# Patient Record
Sex: Female | Born: 1945 | Race: Black or African American | Hispanic: No | Marital: Single | Smoking: Never smoker
Health system: Southern US, Community
[De-identification: ages and names within clinical notes are randomized; demographics above are authoritative.]

## PROBLEM LIST (undated history)

## (undated) DIAGNOSIS — K7460 Unspecified cirrhosis of liver: Secondary | ICD-10-CM

## (undated) DIAGNOSIS — I10 Essential (primary) hypertension: Secondary | ICD-10-CM

## (undated) DIAGNOSIS — K219 Gastro-esophageal reflux disease without esophagitis: Secondary | ICD-10-CM

## (undated) DIAGNOSIS — L03119 Cellulitis of unspecified part of limb: Secondary | ICD-10-CM

## (undated) HISTORY — DX: Cellulitis of unspecified part of limb: L03.119

---

## 2007-05-20 ENCOUNTER — Encounter: Payer: Self-pay | Admitting: Family Medicine

## 2007-06-04 ENCOUNTER — Encounter: Payer: Self-pay | Admitting: Family Medicine

## 2009-01-03 ENCOUNTER — Ambulatory Visit: Payer: Self-pay

## 2014-01-10 ENCOUNTER — Inpatient Hospital Stay: Payer: Self-pay | Admitting: Internal Medicine

## 2014-01-10 LAB — URINALYSIS, COMPLETE
BACTERIA: NONE SEEN
BILIRUBIN, UR: NEGATIVE
Blood: NEGATIVE
Glucose,UR: NEGATIVE mg/dL (ref 0–75)
Leukocyte Esterase: NEGATIVE
Nitrite: NEGATIVE
Ph: 6 (ref 4.5–8.0)
Specific Gravity: 1.021 (ref 1.003–1.030)
Squamous Epithelial: 3

## 2014-01-10 LAB — COMPREHENSIVE METABOLIC PANEL
ALBUMIN: 2.3 g/dL — AB (ref 3.4–5.0)
ALT: 37 U/L (ref 12–78)
ANION GAP: 11 (ref 7–16)
Alkaline Phosphatase: 149 U/L — ABNORMAL HIGH
BILIRUBIN TOTAL: 0.9 mg/dL (ref 0.2–1.0)
BUN: 5 mg/dL — AB (ref 7–18)
CO2: 31 mmol/L (ref 21–32)
CREATININE: 0.46 mg/dL — AB (ref 0.60–1.30)
Calcium, Total: 7.5 mg/dL — ABNORMAL LOW (ref 8.5–10.1)
Chloride: 98 mmol/L (ref 98–107)
EGFR (African American): >60
EGFR (Non-African Amer.): >60
GLUCOSE: 87 mg/dL (ref 65–99)
OSMOLALITY: 276 (ref 275–301)
POTASSIUM: 3.2 mmol/L — AB (ref 3.5–5.1)
SGOT(AST): 127 U/L — ABNORMAL HIGH (ref 15–37)
SODIUM: 140 mmol/L (ref 136–145)
Total Protein: 7 g/dL (ref 6.4–8.2)

## 2014-01-10 LAB — CBC WITH DIFFERENTIAL/PLATELET
BASOS ABS: 0 10*3/uL (ref 0.0–0.1)
BASOS PCT: 0.9 %
EOS PCT: 0.1 %
Eosinophil #: 0 10*3/uL (ref 0.0–0.7)
HCT: 34 % — ABNORMAL LOW (ref 35.0–47.0)
HGB: 11.2 g/dL — AB (ref 12.0–16.0)
LYMPHS ABS: 0.8 10*3/uL — AB (ref 1.0–3.6)
LYMPHS PCT: 27.5 %
MCH: 31.6 pg (ref 26.0–34.0)
MCHC: 32.9 g/dL (ref 32.0–36.0)
MCV: 96 fL (ref 80–100)
Monocyte #: 0.2 x10 3/mm (ref 0.2–0.9)
Monocyte %: 6.7 %
Neutrophil #: 1.8 10*3/uL (ref 1.4–6.5)
Neutrophil %: 64.8 %
Platelet: 87 10*3/uL — ABNORMAL LOW (ref 150–440)
RBC: 3.54 10*6/uL — ABNORMAL LOW (ref 3.80–5.20)
RDW: 20.6 % — ABNORMAL HIGH (ref 11.5–14.5)
WBC: 2.8 10*3/uL — ABNORMAL LOW (ref 3.6–11.0)

## 2014-01-10 LAB — TSH: THYROID STIMULATING HORM: 3.81 u[IU]/mL

## 2014-01-11 LAB — BASIC METABOLIC PANEL
Anion Gap: 12 (ref 7–16)
BUN: 4 mg/dL — AB (ref 7–18)
CALCIUM: 7.2 mg/dL — AB (ref 8.5–10.1)
CREATININE: 0.66 mg/dL (ref 0.60–1.30)
Chloride: 101 mmol/L (ref 98–107)
Co2: 28 mmol/L (ref 21–32)
EGFR (African American): >60
EGFR (Non-African Amer.): >60
GLUCOSE: 72 mg/dL (ref 65–99)
OSMOLALITY: 277 (ref 275–301)
Potassium: 2.9 mmol/L — ABNORMAL LOW (ref 3.5–5.1)
Sodium: 141 mmol/L (ref 136–145)

## 2014-01-11 LAB — CBC WITH DIFFERENTIAL/PLATELET
BASOS ABS: 0 10*3/uL (ref 0.0–0.1)
Basophil %: 0.5 %
EOS ABS: 0 10*3/uL (ref 0.0–0.7)
Eosinophil %: 0.1 %
HCT: 29.9 % — ABNORMAL LOW (ref 35.0–47.0)
HGB: 9.8 g/dL — ABNORMAL LOW (ref 12.0–16.0)
Lymphocyte #: 0.5 10*3/uL — ABNORMAL LOW (ref 1.0–3.6)
Lymphocyte %: 16.2 %
MCH: 31.2 pg (ref 26.0–34.0)
MCHC: 32.7 g/dL (ref 32.0–36.0)
MCV: 96 fL (ref 80–100)
Monocyte #: 0.2 x10 3/mm (ref 0.2–0.9)
Monocyte %: 6.3 %
Neutrophil #: 2.2 10*3/uL (ref 1.4–6.5)
Neutrophil %: 76.9 %
Platelet: 63 10*3/uL — ABNORMAL LOW (ref 150–440)
RBC: 3.13 10*6/uL — AB (ref 3.80–5.20)
RDW: 20.4 % — AB (ref 11.5–14.5)
WBC: 2.8 10*3/uL — AB (ref 3.6–11.0)

## 2014-01-11 LAB — MAGNESIUM: MAGNESIUM: 1.5 mg/dL — AB

## 2014-01-12 LAB — CBC WITH DIFFERENTIAL/PLATELET
Basophil #: 0 10*3/uL (ref 0.0–0.1)
Basophil %: 0.8 %
Eosinophil #: 0 10*3/uL (ref 0.0–0.7)
Eosinophil %: 1.5 %
HCT: 31.6 % — ABNORMAL LOW (ref 35.0–47.0)
HGB: 10.4 g/dL — ABNORMAL LOW (ref 12.0–16.0)
Lymphocyte #: 0.7 10*3/uL — ABNORMAL LOW (ref 1.0–3.6)
Lymphocyte %: 24.4 %
MCH: 32.1 pg (ref 26.0–34.0)
MCHC: 33.1 g/dL (ref 32.0–36.0)
MCV: 97 fL (ref 80–100)
MONOS PCT: 8.4 %
Monocyte #: 0.2 x10 3/mm (ref 0.2–0.9)
NEUTROS ABS: 1.8 10*3/uL (ref 1.4–6.5)
Neutrophil %: 64.9 %
Platelet: 64 10*3/uL — ABNORMAL LOW (ref 150–440)
RBC: 3.25 10*6/uL — ABNORMAL LOW (ref 3.80–5.20)
RDW: 20.4 % — ABNORMAL HIGH (ref 11.5–14.5)
WBC: 2.7 10*3/uL — ABNORMAL LOW (ref 3.6–11.0)

## 2014-01-12 LAB — BASIC METABOLIC PANEL
ANION GAP: 6 — AB (ref 7–16)
BUN: 5 mg/dL — ABNORMAL LOW (ref 7–18)
CHLORIDE: 101 mmol/L (ref 98–107)
CO2: 32 mmol/L (ref 21–32)
CREATININE: 0.61 mg/dL (ref 0.60–1.30)
Calcium, Total: 8.2 mg/dL — ABNORMAL LOW (ref 8.5–10.1)
EGFR (African American): >60
EGFR (Non-African Amer.): >60
Glucose: 102 mg/dL — ABNORMAL HIGH (ref 65–99)
Osmolality: 275 (ref 275–301)
Potassium: 3.2 mmol/L — ABNORMAL LOW (ref 3.5–5.1)
Sodium: 139 mmol/L (ref 136–145)

## 2014-01-13 LAB — MAGNESIUM: MAGNESIUM: 1.6 mg/dL — AB

## 2014-01-13 LAB — POTASSIUM: Potassium: 3.3 mmol/L — ABNORMAL LOW (ref 3.5–5.1)

## 2014-01-14 LAB — BASIC METABOLIC PANEL
ANION GAP: 4 — AB (ref 7–16)
BUN: 4 mg/dL — ABNORMAL LOW (ref 7–18)
CO2: 31 mmol/L (ref 21–32)
Calcium, Total: 7.6 mg/dL — ABNORMAL LOW (ref 8.5–10.1)
Chloride: 104 mmol/L (ref 98–107)
Creatinine: 0.58 mg/dL — ABNORMAL LOW (ref 0.60–1.30)
EGFR (African American): >60
EGFR (Non-African Amer.): >60
Glucose: 96 mg/dL (ref 65–99)
Osmolality: 274 (ref 275–301)
POTASSIUM: 3.3 mmol/L — AB (ref 3.5–5.1)
Sodium: 139 mmol/L (ref 136–145)

## 2014-01-14 LAB — MAGNESIUM: MAGNESIUM: 1.8 mg/dL

## 2014-01-15 LAB — PATHOLOGY REPORT

## 2014-01-15 LAB — CULTURE, BLOOD (SINGLE)

## 2014-01-21 LAB — WOUND CULTURE

## 2014-01-27 ENCOUNTER — Ambulatory Visit: Payer: Self-pay | Admitting: Internal Medicine

## 2014-01-27 LAB — CBC CANCER CENTER
Eosinophil: 2 %
HCT: 32.7 % — AB (ref 35.0–47.0)
HGB: 10.4 g/dL — ABNORMAL LOW (ref 12.0–16.0)
Lymphocytes: 20 %
MCH: 31.2 pg (ref 26.0–34.0)
MCHC: 31.8 g/dL — AB (ref 32.0–36.0)
MCV: 98 fL (ref 80–100)
Monocytes: 17 %
Platelet: 211 x10 3/mm (ref 150–440)
RBC: 3.33 10*6/uL — ABNORMAL LOW (ref 3.80–5.20)
RDW: 17.4 % — AB (ref 11.5–14.5)
Segmented Neutrophils: 61 %
WBC: 3.8 x10 3/mm (ref 3.6–11.0)

## 2014-01-27 LAB — FOLATE: FOLIC ACID: 15.1 ng/mL (ref 3.1–100.0)

## 2014-01-27 LAB — IRON AND TIBC
IRON: 43 ug/dL — AB (ref 50–170)
Iron Bind.Cap.(Total): 215 ug/dL — ABNORMAL LOW (ref 250–450)
Iron Saturation: 20 %
UNBOUND IRON-BIND. CAP.: 172 ug/dL

## 2014-01-27 LAB — LACTATE DEHYDROGENASE: LDH: 337 U/L — AB (ref 81–246)

## 2014-01-27 LAB — RETICULOCYTES
Absolute Retic Count: 0.0507 10*6/uL (ref 0.019–0.186)
RETICULOCYTE: 1.52 % (ref 0.4–3.1)

## 2014-01-27 LAB — FERRITIN: Ferritin (ARMC): 182 ng/mL (ref 8–388)

## 2014-01-29 LAB — APTT: ACTIVATED PTT: 36.8 s — AB (ref 23.6–35.9)

## 2014-01-29 LAB — PROTIME-INR
INR: 1
Prothrombin Time: 13.3 secs (ref 11.5–14.7)

## 2014-01-29 LAB — FIBRIN DEGRADATION PROD.(ARMC ONLY): Fibrin Degradation Prod.: <10 ug/ml (ref 2.1–7.7)

## 2014-01-29 LAB — FIBRINOGEN: Fibrinogen: 436 mg/dL (ref 210–470)

## 2014-02-01 ENCOUNTER — Ambulatory Visit: Payer: Self-pay | Admitting: Internal Medicine

## 2014-02-01 LAB — URINE IEP, RANDOM

## 2014-02-02 LAB — PROT IMMUNOELECTROPHORES(ARMC)

## 2014-02-02 LAB — CULTURE, FUNGUS WITHOUT SMEAR

## 2014-06-18 ENCOUNTER — Ambulatory Visit: Payer: Self-pay | Admitting: Internal Medicine

## 2014-06-18 LAB — CBC CANCER CENTER
BASOS ABS: 0 x10 3/mm (ref 0.0–0.1)
BASOS PCT: 0.9 %
EOS PCT: 0.6 %
Eosinophil #: 0 x10 3/mm (ref 0.0–0.7)
HCT: 34.9 % — AB (ref 35.0–47.0)
HGB: 11.3 g/dL — ABNORMAL LOW (ref 12.0–16.0)
Lymphocyte #: 0.8 x10 3/mm — ABNORMAL LOW (ref 1.0–3.6)
Lymphocyte %: 15.7 %
MCH: 29.9 pg (ref 26.0–34.0)
MCHC: 32.3 g/dL (ref 32.0–36.0)
MCV: 92 fL (ref 80–100)
MONOS PCT: 5.3 %
Monocyte #: 0.3 x10 3/mm (ref 0.2–0.9)
Neutrophil #: 4.1 x10 3/mm (ref 1.4–6.5)
Neutrophil %: 77.5 %
Platelet: 201 x10 3/mm (ref 150–440)
RBC: 3.78 10*6/uL — AB (ref 3.80–5.20)
RDW: 15.5 % — AB (ref 11.5–14.5)
WBC: 5.3 x10 3/mm (ref 3.6–11.0)

## 2014-07-04 ENCOUNTER — Ambulatory Visit: Payer: Self-pay | Admitting: Internal Medicine

## 2014-12-25 NOTE — Consult Note (Signed)
Brief Consult Note: Diagnosis: large venous ulcer.   Patient was seen by consultant.   Comments: would favor cleaning woud with biopsy in the or  no Maggots seen today continue ABx as underlying cellulitis is present would plan for Unna boots then compression with lymphedema pump as long term therapy.  Electronic Signatures: Hortencia Pilar (MD)  (Signed 11-May-15 09:16)  Authored: Brief Consult Note   Last Updated: 11-May-15 09:16 by Hortencia Pilar (MD)

## 2014-12-25 NOTE — Discharge Summary (Signed)
PATIENT NAME:  Morgan Mcpherson, Morgan Mcpherson MR#:  [id number] DATE OF BIRTH:  1946/08/03  DATE OF ADMISSION:  01/10/2014 DATE OF DISCHARGE:  01/15/2014  PRIMARY CARE PHYSICIAN: Nonlocal.  DISCHARGE DIAGNOSES:  1.  Left lower extremity ulcer and cellulitis.  2.  Hypokalemia.  3.  Hypomagnesemia.  4.  Pancytopenia. 5.  Alcohol abuse.   CONDITION: Stable.   CODE STATUS: Full code.   HOME MEDICATIONS: Please refer to the medication reconciliation list.   DIET: Regular diet.   ACTIVITY: As tolerated.   The patient needs home health and physical therapy and also Education officer, museum. The patient needs a rolling walker.   FOLLOWUP CARE: Follow up with PCP within 1 to 2 weeks. Follow up with Dr. Delana Meyer within 1 to 2 weeks. The patient also needs to follow up with Dr. Ma Hillock for pancytopenia within 1 to 2 weeks. The patient also needs alcohol cessation.   REASON FOR ADMISSION: Left leg infection.   HOSPITAL COURSE: The patient is a 69 year old African American female with a history of alcohol abuse. Came to ED due to left leg infection worsening for one week prior to this admission. The patient was getting some antibiotics recently at [facility], but because of the pain and the swelling, the patient came to ED. For detailed history and physical examination, please refer to the admission note dictated by Dr. Benjie Karvonen. Laboratory data on admission date showed WBC 2.8, hemoglobin 11.2, platelets 87,000. Sodium 140, potassium 3.2, chloride 98, BUN 5, creatinine 0.46. Chest x-ray did not show any acute cardiopulmonary disease.  1.  Left leg ulcer with infection: After admission, the patient has been treated with Zosyn. Surgical consult suggested no debridement at this time. They recommended vascular surgery consult and wound care. Dr. Delana Meyer evaluated the patient and recommended local wound care prior to any vascular intervention. He did a wound biopsy. The patient is on wound dressing with Unna boots. Blood culture  is negative so far.  2.  For alcohol abuse, the patient was on CIWA protocol. No withdrawal symptoms.  3.  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 The patient needs to follow up with hematology as outpatient.  4.  Hypokalemia was treated with potassium supplement. 5.  Hypomagnesemia was treated with magnesium supplement, improved.   The patient is clinically stable. According to physical therapy evaluation, the patient needs skilled nursing facility placement; however, the patient refused to go to skilled nursing facility. She wants to go home with home health. The patient will be discharged to home with home health, PT, and social worker. I discussed the patient's discharge plan with the patient, nurse, case manager, Education officer, museum.   TIME SPENT: About 40 minutes.   ____________________________ Demetrios Loll, MD qc:jcm D: 01/15/2014 12:33:14 ET T: 01/15/2014 21:16:44 ET JOB#: [id number]  cc: Demetrios Loll, MD, <Dictator> Demetrios Loll MD ELECTRONICALLY SIGNED 01/23/2014 13:16

## 2014-12-25 NOTE — H&P (Signed)
PATIENT NAME:  Morgan Mcpherson, Morgan Mcpherson MR#:  [id number] DATE OF BIRTH:  02/24/1946  DATE OF ADMISSION:  01/10/2014  PRIMARY CARE PHYSICIAN: [facility].   CHIEF COMPLAINT: Left leg infection.   HISTORY OF PRESENT ILLNESS:  This 69 year old female who says for the past few weeks she has been seeing Morgan Mcpherson for this left leg infection which started out about a year ago with 2 blisters subsequently.  She says over the past week these really have worsened.  They suspected an insect bite about a year ago. She said she was getting some antibiotics recently at [facility] clinic.  She came in today because of the pain and the swelling. In the ER, it was noted her left leg had maggots which I did actually visually see.   REVIEW OF SYSTEMS: CONSTITUTIONAL: No fever. Positive fatigue, weakness.   EYES:  No blurred or double vision.  No glaucoma or cataracts.   EARS, NOSE, AND THROAT:  No ear pain, hearing loss, redness of oropharynx.  RESPIRATORY: No cough, wheezing, hemoptysis, COPD, dyspnea.  CARDIOVASCULAR:  No chest pain, orthopnea.  Positive edema. No arrhythmia, dyspnea on exertion. GASTROINTESTINAL: No nausea, vomiting, diarrhea, abdominal pain, melena, or ulcers. GENITOURINARY:   No dysuria or hematuria.  ENDOCRINE: No polyuria or polydipsia.  HEMATOLOGY AND LYMPHATIC:  Positive left leg ulcer and both of her legs are scaly.  MUSCULOSKELETAL: Limited activity due to her left leg pain.  NEUROLOGICAL: no focal defecits  PSYCHIATRIC:  Anxiety or depression.   PAST MEDICAL HISTORY:  Alcohol abuse.   MEDICATIONS: None.   SOCIAL HISTORY: The patient says she drinks about 6 beers a day. No IV drug use or alcohol.   FAMILY HISTORY: No history of hypertension, diabetes.   PAST SURGICAL HISTORY: None.   PHYSICAL EXAMINATION:  VITAL SIGNS: Temperature 98.4, pulse 108, respirations 18, blood pressure 130/65, 96% on room air. GENERAL: The patient is alert, oriented.  No acute distress.   HEENT:  Head is atraumatic. Pupils are round and reactive, anicteric sclerae.  Mucous membranes are dry. Oropharynx is clear.  NECK: Supple without JVD, carotid bruit, or enlarged thyroid.  CARDIOVASCULAR: Regular rate and rhythm. No murmurs, gallops, or rubs. PMI is not displaced.  LUNGS: Clear to auscultation without crackles, rales, rhonchi, or wheezing. Normal to percussion.  ABDOMEN: Obese, bowel sounds are positive. Nontender. Hard to appreciate organomegaly due to body habitus.  EXTREMITIES: She has tenderness and swelling of her left leg. Both legs are actually swollen with chronic venous stasis changes. Her left leg has confluent area of skin breakdown.  She has maggots that I visually saw.  She has a large ulcer there on that left leg.  NEUROLOGIC: Cranial nerves II through XII are grossly intact.   LABORATORY DATA:  White blood cells 2.8, hemoglobin 11.2, hematocrit 34, platelets are 87,000. Sodium 140, potassium 3.2, chloride 98, bicarbonate 31, BUN 5, creatinine 0.46, glucose 87, AST 37, AST is 127, ALT 37, alkaline phosphatase 149, bilirubin 0.9, calcium 7.5, total protein 7.0, albumin 2.3. TSH 3.81. Chest x-ray shows no acute cardiopulmonary disease.  Tibia fibula left x-ray is negative.   DIAGNOSTIC DATA:  EKG: Normal sinus rhythm. No ST elevations or depressions in the left anterior fascicular block.   ASSESSMENT AND PLAN: A 69 year old female with a history of alcohol abuse who has had an ulcer on her leg for the past year progressively worse, foul-smelling, has maggots.  1. Left leg ulcer as per surgical consultation. They do not recommend  debridement. They do not think that this needs debridement at this time. They recommend vascular surgery and wound care.  I have consulted both vascular surgery and wound care empirically.  I will place the patient on Zosyn and monitor, but ultimately, if they are not going to debride her leg and vascular surgery did not feel they can offer  anything she will just need wound care.  2. Systemic inflammatory response syndrome with leukopenia and tachycardia, likely secondary to number one and/or alcohol abuse. We will monitor.  3. Alcohol abuse.  Patient is on CIWA protocol.  4. Hypokalemia. We will go ahead and replete.  5. Thrombocytopenia suspected from alcohol abuse. Will not use heparin for DVT prophylaxis and cannot really use SCDs on that left leg.   CODE STATUS:   Patient is full code status.    TIME SPENT:  Approximately 55 minutes.   ____________________________  Donell Beers. Benjie Karvonen, MD spm:dd D: 01/10/2014 16:05:15 ET T: 01/10/2014 19:15:47 ET JOB#: [id number]  cc: Shivali Quackenbush P. Benjie Karvonen, MD, <Dictator> Donell Beers Louanne Calvillo MD ELECTRONICALLY SIGNED 01/10/2014 20:41

## 2015-02-02 ENCOUNTER — Ambulatory Visit: Payer: Self-pay | Admitting: Internal Medicine

## 2015-02-02 ENCOUNTER — Other Ambulatory Visit: Payer: Self-pay

## 2015-02-08 ENCOUNTER — Ambulatory Visit: Payer: Self-pay | Admitting: Internal Medicine

## 2015-02-08 ENCOUNTER — Other Ambulatory Visit: Payer: Self-pay

## 2015-02-09 ENCOUNTER — Other Ambulatory Visit: Payer: Self-pay

## 2015-02-09 ENCOUNTER — Ambulatory Visit: Payer: Medicare Other | Admitting: Internal Medicine

## 2015-02-09 ENCOUNTER — Inpatient Hospital Stay: Payer: Medicare Other | Attending: Internal Medicine

## 2015-02-09 DIAGNOSIS — D61818 Other pancytopenia: Secondary | ICD-10-CM | POA: Insufficient documentation

## 2015-02-09 DIAGNOSIS — R634 Abnormal weight loss: Secondary | ICD-10-CM | POA: Insufficient documentation

## 2015-02-09 DIAGNOSIS — R05 Cough: Secondary | ICD-10-CM | POA: Insufficient documentation

## 2015-02-09 DIAGNOSIS — R109 Unspecified abdominal pain: Secondary | ICD-10-CM | POA: Insufficient documentation

## 2015-02-09 DIAGNOSIS — D472 Monoclonal gammopathy: Secondary | ICD-10-CM | POA: Insufficient documentation

## 2015-03-01 ENCOUNTER — Inpatient Hospital Stay: Payer: Medicare Other

## 2015-03-01 ENCOUNTER — Encounter: Payer: Self-pay | Admitting: Internal Medicine

## 2015-03-01 ENCOUNTER — Inpatient Hospital Stay (HOSPITAL_BASED_OUTPATIENT_CLINIC_OR_DEPARTMENT_OTHER): Payer: Medicare Other | Admitting: Internal Medicine

## 2015-03-01 VITALS — BP 160/75 | HR 53 | Temp 98.3°F | Resp 18 | Ht 61.97 in | Wt 160.0 lb

## 2015-03-01 DIAGNOSIS — D61818 Other pancytopenia: Secondary | ICD-10-CM

## 2015-03-01 DIAGNOSIS — D472 Monoclonal gammopathy: Secondary | ICD-10-CM | POA: Diagnosis present

## 2015-03-01 DIAGNOSIS — R05 Cough: Secondary | ICD-10-CM | POA: Diagnosis not present

## 2015-03-01 DIAGNOSIS — R109 Unspecified abdominal pain: Secondary | ICD-10-CM

## 2015-03-01 DIAGNOSIS — R634 Abnormal weight loss: Secondary | ICD-10-CM | POA: Diagnosis not present

## 2015-03-01 LAB — CBC WITH DIFFERENTIAL/PLATELET
BASOS ABS: 0 10*3/uL (ref 0–0.1)
Basophils Relative: 1 %
EOS PCT: 1 %
Eosinophils Absolute: 0 10*3/uL (ref 0–0.7)
HCT: 36.5 % (ref 35.0–47.0)
Hemoglobin: 11.8 g/dL — ABNORMAL LOW (ref 12.0–16.0)
LYMPHS PCT: 39 %
Lymphs Abs: 1.1 10*3/uL (ref 1.0–3.6)
MCH: 31.4 pg (ref 26.0–34.0)
MCHC: 32.3 g/dL (ref 32.0–36.0)
MCV: 97.4 fL (ref 80.0–100.0)
Monocytes Absolute: 0.3 10*3/uL (ref 0.2–0.9)
Monocytes Relative: 13 %
NEUTROS ABS: 1.3 10*3/uL — AB (ref 1.4–6.5)
NEUTROS PCT: 46 %
PLATELETS: 154 10*3/uL (ref 150–440)
RBC: 3.75 MIL/uL — ABNORMAL LOW (ref 3.80–5.20)
RDW: 14.3 % (ref 11.5–14.5)
WBC: 2.7 10*3/uL — AB (ref 3.6–11.0)

## 2015-03-01 LAB — IRON AND TIBC
Iron: 76 ug/dL (ref 28–170)
SATURATION RATIOS: 25 % (ref 10.4–31.8)
TIBC: 310 ug/dL (ref 250–450)
UIBC: 234 ug/dL

## 2015-03-01 LAB — CREATININE, SERUM
CREATININE: 0.53 mg/dL (ref 0.44–1.00)
GFR calc Af Amer: >60 mL/min (ref >60)

## 2015-03-01 LAB — CALCIUM: Calcium: 8 mg/dL — ABNORMAL LOW (ref 8.9–10.3)

## 2015-03-01 LAB — LACTATE DEHYDROGENASE: LDH: 139 U/L (ref 98–192)

## 2015-03-02 LAB — PROTEIN ELECTROPHORESIS, SERUM
A/G Ratio: 1 (ref 0.7–1.7)
ALBUMIN ELP: 3.1 g/dL (ref 2.9–4.4)
ALPHA-1-GLOBULIN: 0.2 g/dL (ref 0.0–0.4)
Alpha-2-Globulin: 0.6 g/dL (ref 0.4–1.0)
BETA GLOBULIN: 1 g/dL (ref 0.7–1.3)
GAMMA GLOBULIN: 1.1 g/dL (ref 0.4–1.8)
GLOBULIN, TOTAL: 3 g/dL (ref 2.2–3.9)
M-SPIKE, %: 0.3 g/dL — AB
Total Protein ELP: 6.1 g/dL (ref 6.0–8.5)

## 2015-03-02 LAB — KAPPA/LAMBDA LIGHT CHAINS
KAPPA, LAMDA LIGHT CHAIN RATIO: 1.39 (ref 0.26–1.65)
Kappa free light chain: 33.86 mg/L — ABNORMAL HIGH (ref 3.30–19.40)
Lambda free light chains: 24.34 mg/L (ref 5.71–26.30)

## 2015-03-04 ENCOUNTER — Ambulatory Visit: Payer: Medicare Other | Admitting: Internal Medicine

## 2015-03-04 ENCOUNTER — Ambulatory Visit: Payer: Medicare Other

## 2015-03-08 ENCOUNTER — Inpatient Hospital Stay: Payer: Medicare Other | Attending: Internal Medicine | Admitting: Internal Medicine

## 2015-03-12 NOTE — Progress Notes (Signed)
Morgan Mcpherson  [phone] [phone]     ID: Morgan Mcpherson OB: 02-22-1946  MR#: [id number]  CSN#:[id number]  No care team member to display  CHIEF COMPLAINT/DIAGNOSIS:  Pancytopenia - likely secondary to alcohol intake. 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 Serum iron 43, TIBC low at 215, iron saturation 20%. PT/INR 1.0, PTT 36.8 seconds. Otherwise serum B12, folate, direct platelet antibody test, ANA, FDP, fibrinogen, HCV Ab, HIV Ab all unremarkable.   (Prior CBC on 01/10/14 showed WBC 2800 with 65% neutrophils, 27% lymphocytes, 6.7% monos, 0.1% eos, Hb 11.2, MCV 96, platelets 87, Cr 0.46, LFT unremarkable except low albumin of 2.3. Repeat CBC on 01/12/14 again showed WBC is low at 2700 with 65% neutrophils, Hb 10.4, platelets 64).  HISTORY OF PRESENT ILLNESS:  Patient returns for continued hematology followup, she was last seen in Oct 2015. She has lost about 40 lbs weight unintentionally. States she is eating the same. She has intermittent cough, intermittent abdominal discomfort. Denies new constipation, diarrhea, blood in stools or urine. She had low blood counts and had undergone workup which showed monoclonal gammopathy of unknown significance and mildly elevated serum LDH of unclear significance. States she is avoiding alcohol intake. Denies any fevers, chills, night sweats, or symptoms to suggest infection. No bleeding issues. No new bone pains.    REVIEW OF SYSTEMS:   ROS As in HPI above. In addition, no fever, chills or sweats. No new headaches or focal weakness.  No sore throat, cough, shortness of breath, sputum, hemoptysis or chest pain. No dizziness or palpitation. No abdominal pain, constipation, diarrhea, dysuria or hematuria. No new skin rash or bleeding  symptoms. No new paresthesias in extremities.   PAST MEDICAL HISTORY: Reviewed. Past Medical History  Diagnosis Date  . Cellulitis, leg           H/o left leg ulcer, infected May 2015 requiring hospitalization and antibiotics. Biopsy on 01/13/14: verrucous epidermal hyperplasia, dermal fibrosis and superficial chronic inflammation. Negative for malignancy.  Pancytopenia  History of regular alcohol intake, quit on 01/10/14.  PAST SURGICAL HISTORY: Reviewed. As above.  FAMILY HISTORY: Reviewed. Family History  Problem Relation Age of Onset  . Cancer Mother     unknown primary  Denies hematological disorders.  SOCIAL HISTORY: Reviewed. History  Substance Use Topics  . Smoking status: Never Smoker   . Smokeless tobacco: Not on file  . Alcohol Use: 0.0 oz/week    0 Standard drinks or equivalent per week     Comment: casual social drinker  Denies smoking. History of regular alcohol intake, about 6 beers daily for many years but has quit since 01/10/14. Denies recreational drug usage.  No Known Allergies  Current Outpatient Prescriptions  Medication Sig Dispense Refill  . NEOMYCIN-POLYMYXIN-HYDROCORTISONE (CORTISPORIN) 1 % SOLN otic solution Place 1 drop into the left ear 4 (four) times daily.     No current facility-administered medications for this visit.    PHYSICAL EXAM: Filed Vitals:   03/01/15 1129  BP: 160/75  Pulse: 53  Temp: 98.3 F (36.8 C)  Resp: 18     Body mass index is 29.29 kg/(m^2).       GENERAL: Patient is alert and oriented and in no acute distress. There is no icterus. HEENT: EOMs intact. Oral exam negative for thrush or lesions. No cervical lymphadenopathy.  LUNGS: Bilaterally clear to auscultation, no rhonchi. ABDOMEN: Soft, nontender. No hepatosplenomegaly clinically.  EXTREMITIES: No pedal edema. LYMPHATICS: No palpable adenopathy in axillary or inguinal areas.   LAB RESULTS:    Component Value Date/Time   NA 139 01/14/2014 0454   K 3.3*  01/14/2014 0454   CL 104 01/14/2014 0454   CO2 31 01/14/2014 0454   GLUCOSE 96 01/14/2014 0454   BUN 4* 01/14/2014 0454   CREATININE 0.53 03/01/2015 1030   CREATININE 0.58* 01/14/2014 0454   CALCIUM 8.0* 03/01/2015 1029   CALCIUM 7.6* 01/14/2014 0454   PROT 7.0 01/10/2014 1430   ALBUMIN 2.3* 01/10/2014 1430   AST 127* 01/10/2014 1430   ALT 37 01/10/2014 1430   ALKPHOS 149* 01/10/2014 1430   GFRNONAA >60 03/01/2015 1030   GFRNONAA >60 01/14/2014 0454   GFRAA >60 03/01/2015 1030   GFRAA >60 01/14/2014 0454   Lab Results  Component Value Date   WBC 2.7* 03/01/2015   NEUTROABS 1.3* 03/01/2015   HGB 11.8* 03/01/2015   HCT 36.5 03/01/2015   MCV 97.4 03/01/2015   PLT 154 03/01/2015    STUDIES: 01/10/14 - WBC 2800 with 65% neutrophils, 27% lymphocytes, 6.7% monocytes, 0.1% eosinophils, Hb 11.2, MCV 96, platelets 87, creatinine 0.46, LFT unremarkable except low albumin of 2.3.  01/12/14 - WBC low at 2700 with 65% neutrophils, Hb 10.4, platelets 64.  01/12/14 - Ultrasound Abdomen: IMPRESSION:  1. No acute findings.  2. Mild hepatomegaly with diffuse hepatic steatosis. A component of cirrhosis is possible. No liver mass or focal lesion.  3. Normal size spleen.   4. Gallbladder sludge.  No stones.   ASSESSMENT / PLAN:   1. Pancytopenia - likely secondary to alcohol intake. 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 Serum iron 43, TIBC low at 215, iron saturation 20%. PT/INR 1.0, PTT 36.8 seconds. Otherwise serum B12, folate, direct platelet antibody test, ANA, FDP, fibrinogen, HCV Ab, HIV Ab all unremarkable  - reviewed labs and d/w patient. Prior w/u was unremarkable for any obvious underlying hematological disorder. CBC repeated today shows WBC again below normal at 2700, hemoglobin better at 11.8,  platelet count low normal at 154K. Patient advised strongly to stay off of taking alcohol, and she is agreeable. She does not need any further intervention or workup for mild anemia at this time but will keep coming here for monitoring of monoclonal gammopathy of unknown significance (MGUS).  2. 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 No treatment needed at this time, plan is continued monitoring 3. 3. Unintentional weight loss- - will get CT chst/abd/pelvis and see her back after it is done. If CT scan is negative for lymphoma or malignancy, then plan is continued monitoring in few months with CBC, iron and TIBC, SIEP, creatinine, calcium, LDH, and kappa/lambda ratio quantitative 4. In between visits, she was advised to call or come to ER in case of any new symptoms or acute sickness. Patient is agreeable to this plan.    Morgan Alf, MD   03/12/2015 5:59 AM

## 2015-03-17 ENCOUNTER — Ambulatory Visit: Admission: RE | Admit: 2015-03-17 | Payer: Medicare Other | Source: Ambulatory Visit

## 2016-06-27 ENCOUNTER — Inpatient Hospital Stay: Payer: Medicare Other | Attending: Hematology and Oncology | Admitting: Hematology and Oncology

## 2016-06-27 ENCOUNTER — Encounter: Payer: Self-pay | Admitting: Hematology and Oncology

## 2016-06-27 ENCOUNTER — Inpatient Hospital Stay: Payer: Medicare Other

## 2016-06-27 VITALS — BP 109/66 | HR 56 | Temp 98.2°F | Resp 18 | Ht 62.21 in | Wt 157.6 lb

## 2016-06-27 DIAGNOSIS — K6289 Other specified diseases of anus and rectum: Secondary | ICD-10-CM | POA: Diagnosis not present

## 2016-06-27 DIAGNOSIS — D61818 Other pancytopenia: Secondary | ICD-10-CM

## 2016-06-27 DIAGNOSIS — L03119 Cellulitis of unspecified part of limb: Secondary | ICD-10-CM | POA: Insufficient documentation

## 2016-06-27 DIAGNOSIS — Z23 Encounter for immunization: Secondary | ICD-10-CM | POA: Diagnosis not present

## 2016-06-27 DIAGNOSIS — Z299 Encounter for prophylactic measures, unspecified: Secondary | ICD-10-CM

## 2016-06-27 DIAGNOSIS — R634 Abnormal weight loss: Secondary | ICD-10-CM | POA: Diagnosis not present

## 2016-06-27 DIAGNOSIS — Z809 Family history of malignant neoplasm, unspecified: Secondary | ICD-10-CM | POA: Insufficient documentation

## 2016-06-27 DIAGNOSIS — D472 Monoclonal gammopathy: Secondary | ICD-10-CM

## 2016-06-27 LAB — CBC WITH DIFFERENTIAL/PLATELET
BASOS PCT: 1 %
Basophils Absolute: 0 10*3/uL (ref 0–0.1)
EOS ABS: 0 10*3/uL (ref 0–0.7)
EOS PCT: 1 %
HCT: 31.4 % — ABNORMAL LOW (ref 35.0–47.0)
Hemoglobin: 10.7 g/dL — ABNORMAL LOW (ref 12.0–16.0)
LYMPHS ABS: 1.3 10*3/uL (ref 1.0–3.6)
Lymphocytes Relative: 45 %
MCH: 31.5 pg (ref 26.0–34.0)
MCHC: 34.2 g/dL (ref 32.0–36.0)
MCV: 92.3 fL (ref 80.0–100.0)
MONOS PCT: 10 %
Monocytes Absolute: 0.3 10*3/uL (ref 0.2–0.9)
Neutro Abs: 1.3 10*3/uL — ABNORMAL LOW (ref 1.4–6.5)
Neutrophils Relative %: 43 %
PLATELETS: 133 10*3/uL — AB (ref 150–440)
RBC: 3.4 MIL/uL — ABNORMAL LOW (ref 3.80–5.20)
RDW: 14.1 % (ref 11.5–14.5)
WBC: 3 10*3/uL — AB (ref 3.6–11.0)

## 2016-06-27 LAB — IRON AND TIBC
Iron: 26 ug/dL — ABNORMAL LOW (ref 28–170)
Saturation Ratios: 15 % (ref 10.4–31.8)
TIBC: 179 ug/dL — ABNORMAL LOW (ref 250–450)
UIBC: 153 ug/dL

## 2016-06-27 LAB — RETICULOCYTES
RBC.: 3.4 MIL/uL — ABNORMAL LOW (ref 3.80–5.20)
Retic Count, Absolute: 30.6 10*3/uL (ref 19.0–183.0)
Retic Ct Pct: 0.9 % (ref 0.4–3.1)

## 2016-06-27 LAB — FERRITIN: FERRITIN: 297 ng/mL (ref 11–307)

## 2016-06-27 LAB — SEDIMENTATION RATE: Sed Rate: 2 mm/hr (ref 0–30)

## 2016-06-27 LAB — VITAMIN B12: Vitamin B-12: 398 pg/mL (ref 180–914)

## 2016-06-27 MED ORDER — INFLUENZA VAC SPLIT QUAD 0.5 ML IM SUSY
0.5000 mL | PREFILLED_SYRINGE | Freq: Once | INTRAMUSCULAR | Status: AC
  Administered 2016-06-27: 0.5 mL via INTRAMUSCULAR
  Filled 2016-06-27: qty 0.5

## 2016-06-27 NOTE — Assessment & Plan Note (Signed)
She has recent rectal discomfort. She never had screening colonoscopy. She declined rectal exam today. I recommend referral to GI but she declined and would like to wait to see how she feels in 2 weeks. I will reassess in 2 weeks

## 2016-06-27 NOTE — Assessment & Plan Note (Signed)
I discussed briefly the natural history of MGUS with the patient. She appeared to have poor understanding of the disease process. I will order for workup and see her back in 2 weeks to review test results with her.

## 2016-06-27 NOTE — Progress Notes (Signed)
[place] progress notes  Patient Care Team: Donnie Coffin, MD as PCP - General (Family Medicine)  CHIEF COMPLAINTS/PURPOSE OF VISIT:  MGUS and pancytopenia  HISTORY OF PRESENTING ILLNESS:  Morgan Mcpherson 70 y.o. female was transferred to my care after her prior physician has left.  I reviewed the patient's records extensive and collaborated the history with the patient. 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 The patient did not show up for imaging study as ordered.  She denies recent infection. No new bone pain. The patient denies any recent signs or symptoms of bleeding such as spontaneous epistaxis, hematuria or hematochezia. She complained of recent rectal pain after bowel movement for the last 2 days. It was not associated with any straining but she had stinging sensation of the bowel movement. She denies recent constipation. Her stool looked normal. The patient never had screening colonoscopy. She claims she has lost 40 pounds of weight but her weight appears to be stable compared to last year's weight. She told the previous oncologist the same concern. She denies recent anorexia, dysphagia or nausea. She does not appear to understand her reason for her to be seen here today.  MEDICAL HISTORY:  Past Medical History:  Diagnosis Date  . Cellulitis, leg     SURGICAL HISTORY: History reviewed. No pertinent surgical history.  SOCIAL HISTORY: Social History   Social History  . Marital status: Single    Spouse name: N/A  . Number of children: 0  . Years of education: N/A   Occupational History  . Psychologist, clinical, retired    Social History Main Topics  . Smoking status: Never Smoker  . Smokeless tobacco: Never Used  . Alcohol use 0.0 oz/week     Comment: casual social drinker  . Drug use: No  . Sexual activity: Not Currently   Other Topics Concern  . Not on file   Social  History Narrative  . No narrative on file    FAMILY HISTORY: Family History  Problem Relation Age of Onset  . Cancer Mother     unknown primary    ALLERGIES:  has No Known Allergies.  MEDICATIONS:  Current Outpatient Prescriptions  Medication Sig Dispense Refill  . NEOMYCIN-POLYMYXIN-HYDROCORTISONE (CORTISPORIN) 1 % SOLN otic solution Place 1 drop into the left ear 4 (four) times daily.     No current facility-administered medications for this visit.     REVIEW OF SYSTEMS:   Constitutional: Denies fevers, chills or abnormal night sweats Eyes: Denies blurriness of vision, double vision or watery eyes Ears, nose, mouth, throat, and face: Denies mucositis or sore throat Respiratory: Denies cough, dyspnea or wheezes Cardiovascular: Denies palpitation, chest discomfort or lower extremity swelling Skin: Denies abnormal skin rashes Lymphatics: Denies new lymphadenopathy or easy bruising Neurological:Denies numbness, tingling or new weaknesses Behavioral/Psych: Mood is stable, no new changes  All other systems were reviewed with the patient and are negative.  PHYSICAL EXAMINATION: ECOG PERFORMANCE STATUS: 0 - Asymptomatic  Vitals:   06/27/16 1343  BP: 109/66  Pulse: (!) 56  Resp: 18  Temp: 98.2 F (36.8 C)   Filed Weights   06/27/16 1343  Weight: 157 lb 10.1 oz (71.5 kg)    GENERAL:alert, no distress and comfortable SKIN: skin color, texture, turgor are normal, no rashes or significant lesions EYES: normal, conjunctiva are pink and non-injected, sclera clear OROPHARYNX:no exudate, normal lips, buccal mucosa, and tongue  NECK: supple, thyroid normal size, non-tender, without nodularity LYMPH:  no palpable lymphadenopathy in the cervical, axillary or inguinal LUNGS: clear to auscultation and percussion with normal breathing effort HEART: regular rate & rhythm and no murmurs without lower extremity edema ABDOMEN:abdomen soft, non-tender and normal bowel  sounds Musculoskeletal:no cyanosis of digits and no clubbing  PSYCH: alert & oriented x 3 with fluent speech NEURO: no focal motor/sensory deficits  LABORATORY DATA:  I have reviewed the data as listed Lab Results  Component Value Date   WBC 2.7 (L) 03/01/2015   HGB 11.8 (L) 03/01/2015   HCT 36.5 03/01/2015   MCV 97.4 03/01/2015   PLT 154 03/01/2015   No results for input(s): NA, K, CL, CO2, GLUCOSE, BUN, CREATININE, CALCIUM, GFRNONAA, GFRAA, PROT, ALBUMIN, AST, ALT, ALKPHOS, BILITOT, BILIDIR, IBILI in the last 8760 hours.  ASSESSMENT & PLAN:  MGUS (monoclonal gammopathy of unknown significance) I discussed briefly the natural history of MGUS with the patient. She appeared to have poor understanding of the disease process. I will order for workup and see her back in 2 weeks to review test results with her.  Other pancytopenia ([place]) She was noted to have pancytopenia in the past. I suspect the anemia could be related to renal disease and leukopenia could be related to her African-American heritage. In any case, I will order repeat workup and see her back in 2 weeks for further assessment  Rectal discomfort She has recent rectal discomfort. She never had screening colonoscopy. She declined rectal exam today. I recommend referral to GI but she declined and would like to wait to see how she feels in 2 weeks. I will reassess in 2 weeks  Preventive measure We discussed the importance of preventive care and reviewed the vaccination programs. She does not have any prior allergic reactions to influenza vaccination. She agrees to proceed with influenza vaccination today and we will administer it today at the clinic.    Orders Placed This Encounter  Procedures  . CBC with Differential/Platelet    Standing Status:   Future    Number of Occurrences:   1    Standing Expiration Date:   08/01/2017  . Ferritin    Standing Status:   Future    Number of Occurrences:   1    Standing  Expiration Date:   08/01/2017  . Iron and TIBC    Standing Status:   Future    Number of Occurrences:   1    Standing Expiration Date:   08/01/2017  . Vitamin B12    Standing Status:   Future    Number of Occurrences:   1    Standing Expiration Date:   08/01/2017  . Sedimentation rate    Standing Status:   Future    Number of Occurrences:   1    Standing Expiration Date:   08/01/2017  . Kappa/lambda light chains    Standing Status:   Future    Number of Occurrences:   1    Standing Expiration Date:   08/01/2017  . Multiple Myeloma Panel (SPEP&IFE w/QIG)    Standing Status:   Future    Number of Occurrences:   1    Standing Expiration Date:   08/01/2017  . Erythropoietin    Standing Status:   Future    Number of Occurrences:   1    Standing Expiration Date:   08/01/2017  . Reticulocytes ([place])    Standing Status:   Future  Standing Expiration Date:   08/01/2017    All questions were answered. The patient knows to call the clinic with any problems, questions or concerns. I spent 25 minutes counseling the patient face to face. The total time spent in the appointment was 40 minutes and more than 50% was on counseling.     Heath Lark, MD 06/27/2016 2:36 PM

## 2016-06-27 NOTE — Assessment & Plan Note (Signed)
We discussed the importance of preventive care and reviewed the vaccination programs. She does not have any prior allergic reactions to influenza vaccination. She agrees to proceed with influenza vaccination today and we will administer it today at the clinic.  

## 2016-06-27 NOTE — Progress Notes (Signed)
Patient does not offer any problems today.  

## 2016-06-27 NOTE — Assessment & Plan Note (Signed)
She was noted to have pancytopenia in the past. I suspect the anemia could be related to renal disease and leukopenia could be related to her African-American heritage. In any case, I will order repeat workup and see her back in 2 weeks for further assessment

## 2016-06-28 LAB — ERYTHROPOIETIN: Erythropoietin: 13.3 m[IU]/mL (ref 2.6–18.5)

## 2016-06-28 LAB — KAPPA/LAMBDA LIGHT CHAINS
KAPPA, LAMDA LIGHT CHAIN RATIO: 1.16 (ref 0.26–1.65)
Kappa free light chain: 33.9 mg/L — ABNORMAL HIGH (ref 3.3–19.4)
Lambda free light chains: 29.3 mg/L — ABNORMAL HIGH (ref 5.7–26.3)

## 2016-06-29 LAB — MULTIPLE MYELOMA PANEL, SERUM
ALBUMIN SERPL ELPH-MCNC: 2.8 g/dL — AB (ref 2.9–4.4)
ALPHA 1: 0.3 g/dL (ref 0.0–0.4)
ALPHA2 GLOB SERPL ELPH-MCNC: 0.6 g/dL (ref 0.4–1.0)
Albumin/Glob SerPl: 1 (ref 0.7–1.7)
B-GLOBULIN SERPL ELPH-MCNC: 0.9 g/dL (ref 0.7–1.3)
GAMMA GLOB SERPL ELPH-MCNC: 1.2 g/dL (ref 0.4–1.8)
GLOBULIN, TOTAL: 3 g/dL (ref 2.2–3.9)
IGG (IMMUNOGLOBIN G), SERUM: 927 mg/dL (ref 700–1600)
IgA: 612 mg/dL — ABNORMAL HIGH (ref 87–352)
IgM, Serum: 84 mg/dL (ref 26–217)
M PROTEIN SERPL ELPH-MCNC: 0.3 g/dL — AB
TOTAL PROTEIN ELP: 5.8 g/dL — AB (ref 6.0–8.5)

## 2016-07-11 ENCOUNTER — Inpatient Hospital Stay: Payer: Medicare Other | Attending: Hematology and Oncology | Admitting: Hematology and Oncology

## 2016-07-11 VITALS — BP 135/73 | HR 78 | Temp 98.0°F | Resp 18 | Wt 164.6 lb

## 2016-07-11 DIAGNOSIS — D472 Monoclonal gammopathy: Secondary | ICD-10-CM

## 2016-07-11 DIAGNOSIS — D61818 Other pancytopenia: Secondary | ICD-10-CM | POA: Diagnosis not present

## 2016-07-11 DIAGNOSIS — D696 Thrombocytopenia, unspecified: Secondary | ICD-10-CM | POA: Insufficient documentation

## 2016-07-11 DIAGNOSIS — D649 Anemia, unspecified: Secondary | ICD-10-CM | POA: Insufficient documentation

## 2016-07-11 DIAGNOSIS — D638 Anemia in other chronic diseases classified elsewhere: Secondary | ICD-10-CM

## 2016-07-11 DIAGNOSIS — D72819 Decreased white blood cell count, unspecified: Secondary | ICD-10-CM

## 2016-07-11 NOTE — Assessment & Plan Note (Signed)
I discussed briefly the natural history of MGUS with the patient. She appeared to have poor understanding of the disease process. Repeat blood work show IgG kappa with low M spike detectable She is not symptomatic Recommend yearly visit and close observation

## 2016-07-11 NOTE — Assessment & Plan Note (Signed)
She has minor pancytopenia Each will be addressed separately I will order ANA screening next year to exclude autoimmune disorder

## 2016-07-11 NOTE — Assessment & Plan Note (Signed)
She has very minor thrombocytopenia recently which I suspect was related to recent alcohol intake Her ultrasound liver 2015 showed possible fatty liver disease/early cirrhosis From the thrombocytopenia standpoint, I recommend refraining from alcohol intake and close monitoring

## 2016-07-11 NOTE — Assessment & Plan Note (Signed)
The patient have chronic leukopenia that fluctuated up and down I believed that is likely related to her African-American heritage She is not symptomatic. Recommend observation only

## 2016-07-11 NOTE — Assessment & Plan Note (Signed)
The anemia is likely anemia of chronic disease, with mild iron deficiency I recommend oral iron supplement and plan to recheck iron studies next year

## 2016-07-11 NOTE — Progress Notes (Signed)
[name] OFFICE PROGRESS NOTE  Patient Care Team: Donnie Coffin, MD as PCP - General (Family Medicine)  SUMMARY OF ONCOLOGIC HISTORY:  Morgan Mcpherson 70 y.o. female was transferred to my care after her prior physician has left.  I reviewed the patient's records extensive and collaborated the history with the patient. 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 The patient did not show up for imaging study as ordered. She subsequently returned in October 2017 for further follow-up  INTERVAL HISTORY: Please see below for problem oriented charting. She returns with her granddaughter today. She feels fine. Her recent rectal discomfort had resolved  REVIEW OF SYSTEMS:   Constitutional: Denies fevers, chills or abnormal weight loss Eyes: Denies blurriness of vision Ears, nose, mouth, throat, and face: Denies mucositis or sore throat Respiratory: Denies cough, dyspnea or wheezes Cardiovascular: Denies palpitation, chest discomfort or lower extremity swelling Gastrointestinal:  Denies nausea, heartburn or change in bowel habits Skin: Denies abnormal skin rashes Lymphatics: Denies new lymphadenopathy or easy bruising Neurological:Denies numbness, tingling or new weaknesses Behavioral/Psych: Mood is stable, no new changes  All other systems were reviewed with the patient and are negative.  I have reviewed the past medical history, past surgical history, social history and family history with the patient and they are unchanged from previous note.  ALLERGIES:  has No Known Allergies.  MEDICATIONS:  Current Outpatient Prescriptions  Medication Sig Dispense Refill  . NEOMYCIN-POLYMYXIN-HYDROCORTISONE (CORTISPORIN) 1 % SOLN otic solution Place 1 drop into the left ear 4 (four) times daily.     No current facility-administered medications for this visit.     PHYSICAL EXAMINATION: ECOG PERFORMANCE STATUS: 1 -  Symptomatic but completely ambulatory  Vitals:   07/11/16 1130  BP: 135/73  Pulse: 78  Resp: 18  Temp: 98 F (36.7 C)   Filed Weights   07/11/16 1130  Weight: 164 lb 9.2 oz (74.6 kg)    GENERAL:alert, no distress and comfortable SKIN: skin color, texture, turgor are normal, no rashes or significant lesions EYES: normal, Conjunctiva are pink and non-injected, sclera clear Musculoskeletal:no cyanosis of digits and no clubbing  NEURO: alert & oriented x 3 with fluent speech, no focal motor/sensory deficits  LABORATORY DATA:  I have reviewed the data as listed    Component Value Date/Time   NA 139 01/14/2014 0454   K 3.3 (L) 01/14/2014 0454   CL 104 01/14/2014 0454   CO2 31 01/14/2014 0454   GLUCOSE 96 01/14/2014 0454   BUN 4 (L) 01/14/2014 0454   CREATININE 0.53 03/01/2015 1030   CREATININE 0.58 (L) 01/14/2014 0454   CALCIUM 8.0 (L) 03/01/2015 1029   CALCIUM 7.6 (L) 01/14/2014 0454   PROT 7.0 01/10/2014 1430   ALBUMIN 2.3 (L) 01/10/2014 1430   AST 127 (H) 01/10/2014 1430   ALT 37 01/10/2014 1430   ALKPHOS 149 (H) 01/10/2014 1430   BILITOT 0.9 01/10/2014 1430   GFRNONAA >60 03/01/2015 1030   GFRNONAA >60 01/14/2014 0454   GFRAA >60 03/01/2015 1030   GFRAA >60 01/14/2014 0454    No results found for: SPEP, UPEP  Lab Results  Component Value Date   WBC 3.0 (L) 06/27/2016   NEUTROABS 1.3 (L) 06/27/2016   HGB 10.7 (L) 06/27/2016   HCT 31.4 (L) 06/27/2016   MCV 92.3 06/27/2016   PLT 133 (L) 06/27/2016      Chemistry      Component  Value Date/Time   NA 139 01/14/2014 0454   K 3.3 (L) 01/14/2014 0454   CL 104 01/14/2014 0454   CO2 31 01/14/2014 0454   BUN 4 (L) 01/14/2014 0454   CREATININE 0.53 03/01/2015 1030   CREATININE 0.58 (L) 01/14/2014 0454      Component Value Date/Time   CALCIUM 8.0 (L) 03/01/2015 1029   CALCIUM 7.6 (L) 01/14/2014 0454   ALKPHOS 149 (H) 01/10/2014 1430   AST 127 (H) 01/10/2014 1430   ALT 37 01/10/2014 1430   BILITOT 0.9  01/10/2014 1430      ASSESSMENT & PLAN:  MGUS (monoclonal gammopathy of unknown significance) I discussed briefly the natural history of MGUS with the patient. She appeared to have poor understanding of the disease process. Repeat blood work show IgG kappa with low M spike detectable She is not symptomatic Recommend yearly visit and close observation  Other pancytopenia ([place]) She has minor pancytopenia Each will be addressed separately I will order ANA screening next year to exclude autoimmune disorder  Chronic leukopenia The patient have chronic leukopenia that fluctuated up and down I believed that is likely related to her African-American heritage She is not symptomatic. 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 CBC with Differential/Platelet    Standing Status:   Future    Standing Expiration Date:   08/15/2017  . Comprehensive metabolic panel    Standing Status:   Future    Standing Expiration Date:   08/15/2017  . Kappa/lambda light chains    Standing Status:   Future    Standing Expiration Date:   08/15/2017  . Multiple Myeloma Panel (SPEP&IFE w/QIG)    Standing Status:   Future    Standing Expiration Date:   08/15/2017  . Iron and TIBC    Standing Status:   Future    Standing Expiration Date:   08/15/2017  . Ferritin    Standing Status:   Future    Standing Expiration Date:   08/15/2017  . ANA w/Reflex if Positive    Standing Status:   Future     Standing Expiration Date:   08/15/2017   All questions were answered. The patient knows to call the clinic with any problems, questions or concerns. No barriers to learning was detected. I spent 15 minutes counseling the patient face to face. The total time spent in the appointment was 20 minutes and more than 50% was on counseling and review of test results     Heath Lark, MD 07/11/2016 12:35 PM

## 2016-07-12 ENCOUNTER — Telehealth (INDEPENDENT_AMBULATORY_CARE_PROVIDER_SITE_OTHER): Payer: Self-pay | Admitting: Vascular Surgery

## 2016-07-12 NOTE — Telephone Encounter (Signed)
Pt lvm and stated she wanted to speak to someone concerning trouble with her legs, itching and swelling around ankles. Call back # [phone]

## 2016-07-19 NOTE — Telephone Encounter (Signed)
I agree with next available doesn't seem like an emergency

## 2016-08-21 ENCOUNTER — Other Ambulatory Visit: Payer: Self-pay | Admitting: Family Medicine

## 2016-08-21 DIAGNOSIS — Z1239 Encounter for other screening for malignant neoplasm of breast: Secondary | ICD-10-CM

## 2016-09-24 ENCOUNTER — Ambulatory Visit: Payer: Medicare Other

## 2016-10-23 ENCOUNTER — Ambulatory Visit: Payer: Medicare Other

## 2016-11-16 ENCOUNTER — Ambulatory Visit: Payer: Medicare Other

## 2017-02-21 ENCOUNTER — Other Ambulatory Visit: Payer: Self-pay | Admitting: Family Medicine

## 2017-02-21 DIAGNOSIS — R140 Abdominal distension (gaseous): Secondary | ICD-10-CM

## 2017-02-27 ENCOUNTER — Ambulatory Visit
Admission: RE | Admit: 2017-02-27 | Discharge: 2017-02-27 | Disposition: A | Discharge status: Home / Self Care | Payer: Medicare Other | Source: Ambulatory Visit | Attending: Family Medicine | Admitting: Family Medicine

## 2017-02-27 DIAGNOSIS — R140 Abdominal distension (gaseous): Secondary | ICD-10-CM | POA: Diagnosis present

## 2017-02-27 DIAGNOSIS — K829 Disease of gallbladder, unspecified: Secondary | ICD-10-CM | POA: Diagnosis not present

## 2017-02-27 DIAGNOSIS — K769 Liver disease, unspecified: Secondary | ICD-10-CM | POA: Insufficient documentation

## 2017-03-15 ENCOUNTER — Other Ambulatory Visit: Payer: Self-pay | Admitting: Family Medicine

## 2017-03-15 DIAGNOSIS — R140 Abdominal distension (gaseous): Secondary | ICD-10-CM

## 2017-03-29 ENCOUNTER — Ambulatory Visit: Admission: RE | Admit: 2017-03-29 | Payer: Medicare Other | Source: Ambulatory Visit

## 2017-04-03 ENCOUNTER — Other Ambulatory Visit: Payer: Self-pay

## 2017-04-03 ENCOUNTER — Inpatient Hospital Stay: Payer: Medicare Other | Attending: Oncology

## 2017-04-03 DIAGNOSIS — D472 Monoclonal gammopathy: Secondary | ICD-10-CM | POA: Insufficient documentation

## 2017-04-03 DIAGNOSIS — D638 Anemia in other chronic diseases classified elsewhere: Secondary | ICD-10-CM

## 2017-04-03 LAB — COMPREHENSIVE METABOLIC PANEL
ALK PHOS: 79 U/L (ref 38–126)
ALT: 17 U/L (ref 14–54)
ANION GAP: 10 (ref 5–15)
AST: 24 U/L (ref 15–41)
Albumin: 3.9 g/dL (ref 3.5–5.0)
BILIRUBIN TOTAL: 0.5 mg/dL (ref 0.3–1.2)
BUN: 10 mg/dL (ref 6–20)
CALCIUM: 9.2 mg/dL (ref 8.9–10.3)
CO2: 27 mmol/L (ref 22–32)
Chloride: 97 mmol/L — ABNORMAL LOW (ref 101–111)
Creatinine, Ser: 0.62 mg/dL (ref 0.44–1.00)
GFR calc non Af Amer: >60 mL/min (ref >60)
Glucose, Bld: 122 mg/dL — ABNORMAL HIGH (ref 65–99)
Potassium: 4.2 mmol/L (ref 3.5–5.1)
Sodium: 134 mmol/L — ABNORMAL LOW (ref 135–145)
TOTAL PROTEIN: 7.3 g/dL (ref 6.5–8.1)

## 2017-04-03 LAB — IRON AND TIBC
Iron: 61 ug/dL (ref 28–170)
SATURATION RATIOS: 15 % (ref 10.4–31.8)
TIBC: 402 ug/dL (ref 250–450)
UIBC: 341 ug/dL

## 2017-04-03 LAB — CBC WITH DIFFERENTIAL/PLATELET
Basophils Absolute: 0 10*3/uL (ref 0–0.1)
Basophils Relative: 1 %
Eosinophils Absolute: 0 10*3/uL (ref 0–0.7)
Eosinophils Relative: 1 %
HEMATOCRIT: 39.4 % (ref 35.0–47.0)
HEMOGLOBIN: 13.6 g/dL (ref 12.0–16.0)
LYMPHS ABS: 0.9 10*3/uL — AB (ref 1.0–3.6)
Lymphocytes Relative: 27 %
MCH: 30 pg (ref 26.0–34.0)
MCHC: 34.4 g/dL (ref 32.0–36.0)
MCV: 87 fL (ref 80.0–100.0)
MONOS PCT: 8 %
Monocytes Absolute: 0.3 10*3/uL (ref 0.2–0.9)
NEUTROS ABS: 2.2 10*3/uL (ref 1.4–6.5)
NEUTROS PCT: 63 %
Platelets: 144 10*3/uL — ABNORMAL LOW (ref 150–440)
RBC: 4.52 MIL/uL (ref 3.80–5.20)
RDW: 13.8 % (ref 11.5–14.5)
WBC: 3.5 10*3/uL — ABNORMAL LOW (ref 3.6–11.0)

## 2017-04-03 LAB — FERRITIN: Ferritin: 94 ng/mL (ref 11–307)

## 2017-04-04 LAB — KAPPA/LAMBDA LIGHT CHAINS
KAPPA FREE LGHT CHN: 31.1 mg/L — AB (ref 3.3–19.4)
Kappa, lambda light chain ratio: 1.4 (ref 0.26–1.65)
Lambda free light chains: 22.2 mg/L (ref 5.7–26.3)

## 2017-04-04 LAB — ANA W/REFLEX IF POSITIVE: ANA: NEGATIVE

## 2017-04-05 LAB — MULTIPLE MYELOMA PANEL, SERUM
ALBUMIN SERPL ELPH-MCNC: 3.6 g/dL (ref 2.9–4.4)
ALPHA 1: 0.3 g/dL (ref 0.0–0.4)
ALPHA2 GLOB SERPL ELPH-MCNC: 0.6 g/dL (ref 0.4–1.0)
Albumin/Glob SerPl: 1.1 (ref 0.7–1.7)
B-GLOBULIN SERPL ELPH-MCNC: 1.2 g/dL (ref 0.7–1.3)
GAMMA GLOB SERPL ELPH-MCNC: 1.3 g/dL (ref 0.4–1.8)
GLOBULIN, TOTAL: 3.3 g/dL (ref 2.2–3.9)
IgA: 549 mg/dL — ABNORMAL HIGH (ref 87–352)
IgG (Immunoglobin G), Serum: 1010 mg/dL (ref 700–1600)
IgM, Serum: 83 mg/dL (ref 26–217)
M PROTEIN SERPL ELPH-MCNC: 0.2 g/dL — AB
Total Protein ELP: 6.9 g/dL (ref 6.0–8.5)

## 2017-04-09 ENCOUNTER — Inpatient Hospital Stay: Payer: Medicare Other | Admitting: Oncology

## 2017-04-10 ENCOUNTER — Ambulatory Visit: Payer: Medicare Other

## 2017-04-16 ENCOUNTER — Ambulatory Visit
Admission: RE | Admit: 2017-04-16 | Discharge: 2017-04-16 | Disposition: A | Discharge status: Home / Self Care | Payer: Medicare Other | Source: Ambulatory Visit | Attending: Family Medicine | Admitting: Family Medicine

## 2017-04-16 DIAGNOSIS — K5730 Diverticulosis of large intestine without perforation or abscess without bleeding: Secondary | ICD-10-CM | POA: Diagnosis not present

## 2017-04-16 DIAGNOSIS — R140 Abdominal distension (gaseous): Secondary | ICD-10-CM | POA: Insufficient documentation

## 2017-04-16 HISTORY — DX: Essential (primary) hypertension: I10

## 2017-04-16 MED ORDER — IOPAMIDOL (ISOVUE-300) INJECTION 61%
100.0000 mL | Freq: Once | INTRAVENOUS | Status: AC | PRN
  Administered 2017-04-16: 100 mL via INTRAVENOUS

## 2017-05-02 ENCOUNTER — Inpatient Hospital Stay: Payer: Medicare Other | Admitting: Oncology

## 2017-05-16 ENCOUNTER — Inpatient Hospital Stay: Payer: Medicare Other | Admitting: Oncology

## 2017-06-06 ENCOUNTER — Inpatient Hospital Stay: Payer: Medicare Other | Admitting: Oncology

## 2017-07-09 ENCOUNTER — Ambulatory Visit: Payer: Medicare Other | Admitting: Oncology

## 2017-07-12 ENCOUNTER — Ambulatory Visit: Payer: Medicare Other | Admitting: Oncology

## 2017-07-15 NOTE — Progress Notes (Deleted)
Hematology/Oncology Consult note [facility]  [phone] [phone]  Patient Care Team: Donnie Coffin, MD as PCP - General (Family Medicine)   Name of the patient: Morgan Mcpherson  [id number]  September 28, 1945   Date of visit: 07/15/17  Diagnosis- 1. MGUS 2. pancytopenia  Chief complaint/ Reason for visit- routine f/u of MGUS and pancytopenia  Heme/Onc history: Patient was last seen by Dr. Alvy Bimler in November 2017.  With regards to her leukopenia her white count fluctuates but has remained overall stable between 2.7-3.5.  This was attributed to her African-American ethnicity.  Patient also has mild thrombocytopenia and her platelet counts most recently have fluctuated between 130s-200s likely secondary to chronic alcohol intake.  Patient also noted to have mild anemia with a hemoglobin fluctuating between 10-11 likely secondary to chronic disease.  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  HENT: Negative for congestion, ear discharge and nosebleeds.   Eyes: Negative for blurred vision.  Respiratory: Negative for cough, hemoptysis, sputum production, shortness of breath and wheezing.   Cardiovascular: Negative for chest pain, palpitations, orthopnea and claudication.  Gastrointestinal: Negative for abdominal pain, blood in stool, constipation, diarrhea, heartburn, melena, nausea and vomiting.  Genitourinary: Negative for dysuria, flank pain, frequency, hematuria and urgency.  Musculoskeletal: Negative for back pain, joint pain and myalgias.  Skin: Negative for rash.  Neurological: Negative for dizziness, tingling, focal weakness, seizures, weakness and headaches.  Endo/Heme/Allergies: Does not bruise/bleed  easily.  Psychiatric/Behavioral: Negative for depression and suicidal ideas. The patient does not have insomnia.       No Known Allergies   Past Medical History:  Diagnosis Date  . Cellulitis, leg   . Hypertension      No past surgical history on file.  Social History   Socioeconomic History  . Marital status: Single    Spouse name: Not on file  . Number of children: 0  . Years of education: Not on file  . Highest education level: Not on file  Social Needs  . Financial resource strain: Not on file  . Food insecurity - worry: Not on file  . Food insecurity - inability: Not on file  . Transportation needs - medical: Not on file  . Transportation needs - non-medical: Not on file  Occupational History  . Occupation: Psychologist, clinical, retired  Tobacco Use  . Smoking status: Never Smoker  . Smokeless tobacco: Never Used  Substance and Sexual Activity  . Alcohol use: Yes    Alcohol/week: 0.0 oz    Comment: casual social drinker  . Drug use: No  . Sexual activity: Not Currently  Other Topics Concern  . Not on file  Social History Narrative  . Not on file    Family History  Problem Relation Age of Onset  . Cancer Mother        unknown primary     Current Outpatient Medications:  .  NEOMYCIN-POLYMYXIN-HYDROCORTISONE (CORTISPORIN) 1 % SOLN otic solution, Place 1 drop into the left ear 4 (four) times daily., Disp: , Rfl:   Physical exam: There were no vitals filed for this visit. Physical Exam  Constitutional: She is oriented to person, place, and time and well-developed, well-nourished, and in no distress.  HENT:  Head: Normocephalic and atraumatic.  Eyes: EOM  are normal. Pupils are equal, round, and reactive to light.  Neck: Normal range of motion.  Cardiovascular: Normal rate, regular rhythm and normal heart sounds.  Pulmonary/Chest: Effort normal and breath sounds normal.  Abdominal: Soft. Bowel sounds are normal.  Neurological: She is alert and oriented to  person, place, and time.  Skin: Skin is warm and dry.     CMP Latest Ref Rng & Units 04/03/2017  Glucose 65 - 99 mg/dL 122(H)  BUN 6 - 20 mg/dL 10  Creatinine 0.44 - 1.00 mg/dL 0.62  Sodium 135 - 145 mmol/L 134(L)  Potassium 3.5 - 5.1 mmol/L 4.2  Chloride 101 - 111 mmol/L 97(L)  CO2 22 - 32 mmol/L 27  Calcium 8.9 - 10.3 mg/dL 9.2  Total Protein 6.5 - 8.1 g/dL 7.3  Total Bilirubin 0.3 - 1.2 mg/dL 0.5  Alkaline Phos 38 - 126 U/L 79  AST 15 - 41 U/L 24  ALT 14 - 54 U/L 17   CBC Latest Ref Rng & Units 04/03/2017  WBC 3.6 - 11.0 K/uL 3.5(L)  Hemoglobin 12.0 - 16.0 g/dL 13.6  Hematocrit 35.0 - 47.0 % 39.4  Platelets 150 - 440 K/uL 144(L)    No images are attached to the encounter.  No results found.   Assessment and plan- Patient is a 71 y.o. female who is being followed for MGUS and pancytopenia  1.  MGUS: Recent myeloma panel on 04/03/2017 showed IgG kappa M protein of 0.2 g which has remained stable over the last 1 year.  Serum free light chains reveal elevated kappa of 31.1 and normal ratio 1.4 which is remained stable over the last 2 years.  CMP did not reveal any evidence of hypercalcemia and serum creatinine was normal at 0.6.  CBC showed improved hemoglobin of 13.6.  We will continue to monitor her MGUS on a yearly basis  2.  Pancytopenia: With regards to her leukopenia her white count waxes and wanes in 3 months ago her white count was 3.5 and a platelet count was 144.  We will repeat CBC with differential today.  Patient had some evidence of iron deficiency in the past but iron studies from August 2018 did not reveal any evidence of iron deficiency  I will see her back in 1 years time.  We will check CBC, CMP, multiple myeloma panel and free light chains 1 week prior.   Visit Diagnosis 1. MGUS (monoclonal gammopathy of unknown significance)   2. Thrombocytopenia ([place])   3. Chronic leukopenia   4. 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 Randa Evens, MD, MPH [facility] at  [facility] Pager- [phone] 07/15/2017 12:03 PM

## 2017-07-16 ENCOUNTER — Inpatient Hospital Stay: Payer: Medicare Other | Admitting: Oncology

## 2018-03-16 IMAGING — US US ABDOMEN COMPLETE
1 series · 14 of 25 positions shown · non-contrast
Comparison: Ultrasound 01/12/2014.

CLINICAL DATA: Abdominal distention .

EXAM:
ABDOMEN ULTRASOUND COMPLETE

[Series 1: us abdomen complete · 0.23mm/px · 14 of 82 slices shown]
[im 1/82]
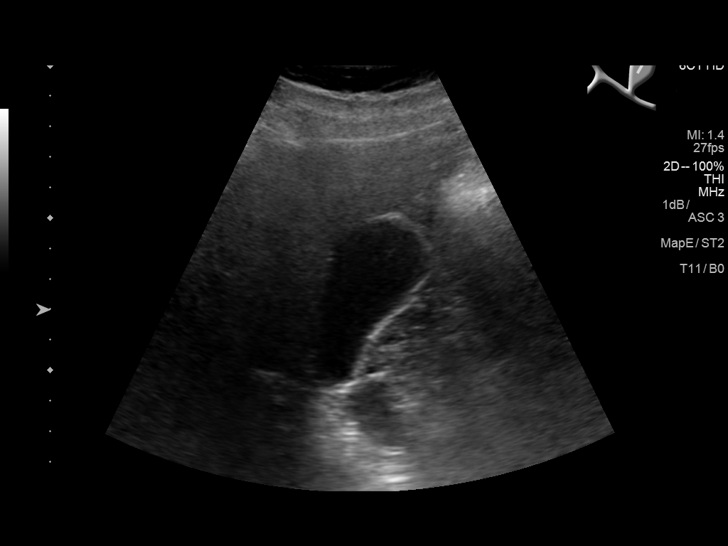
[im 7/82]
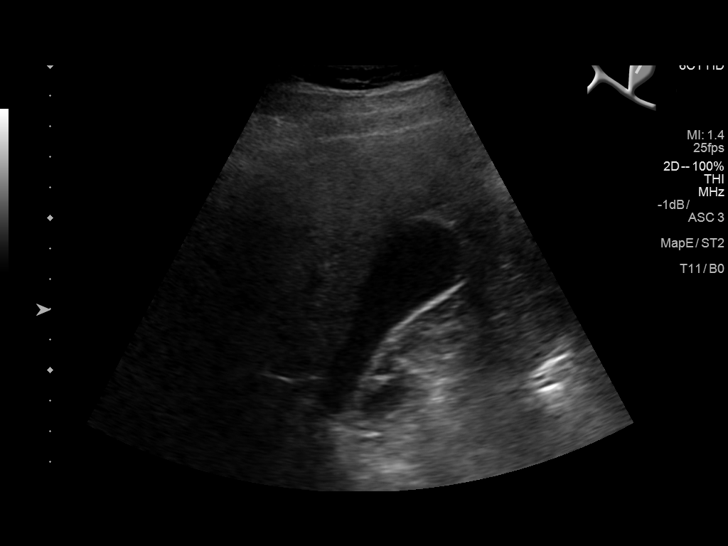
[im 14/82]
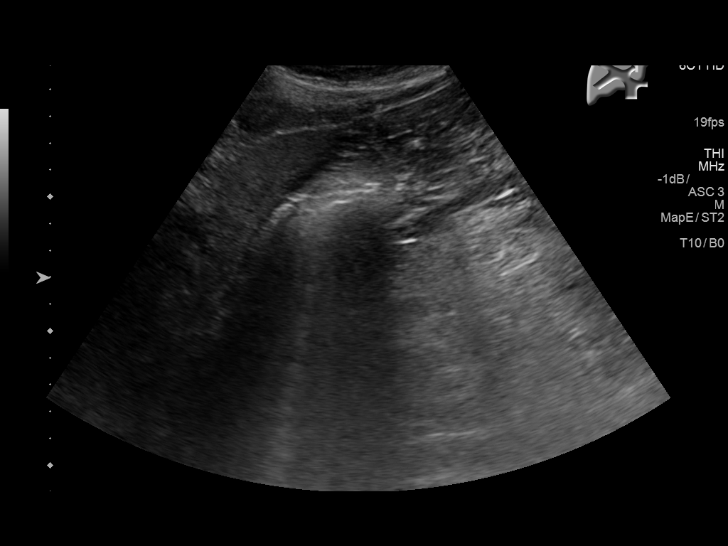
[im 21/82]
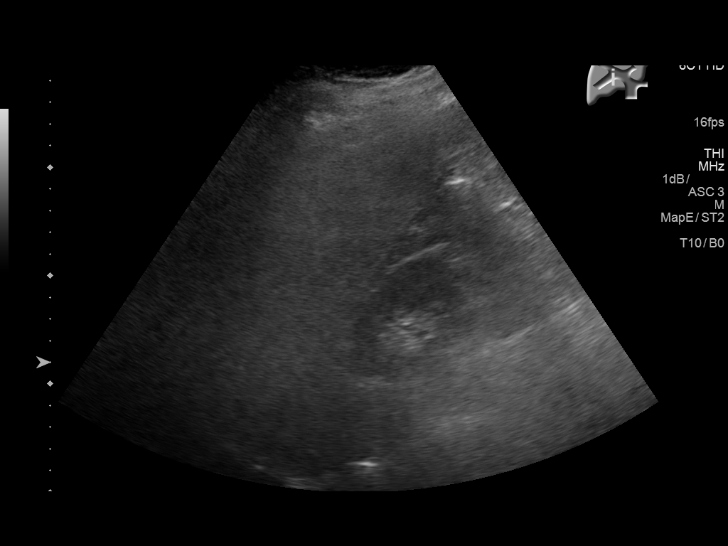
[im 28/82]
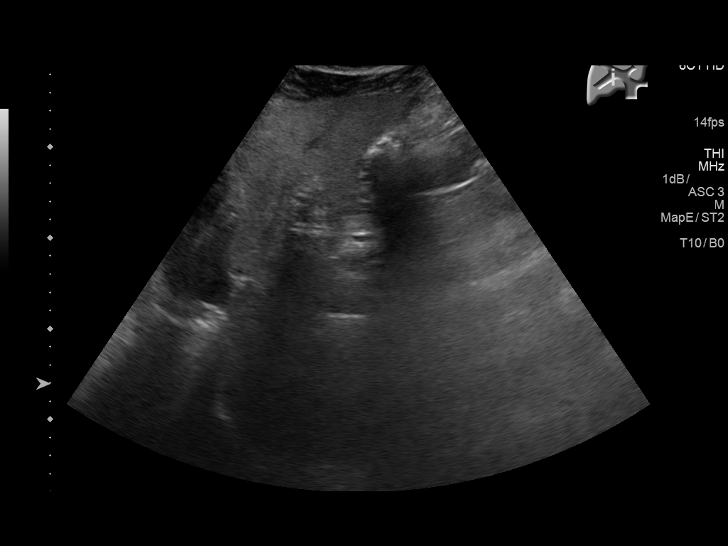
[im 31/82]
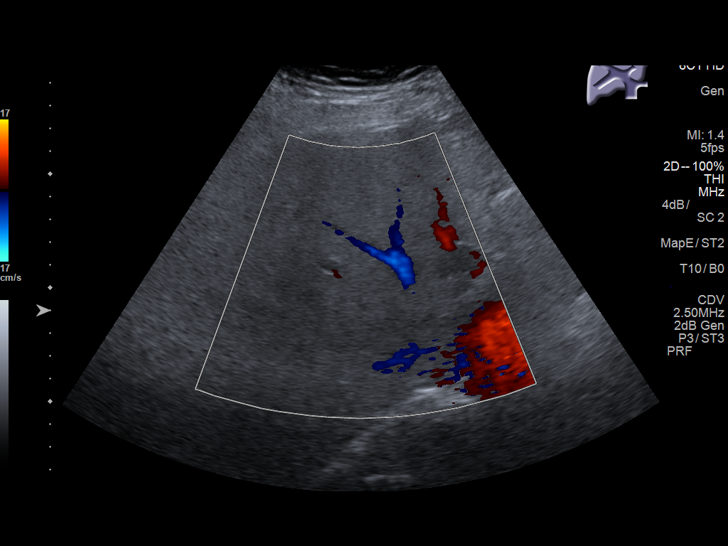
[im 38/82]
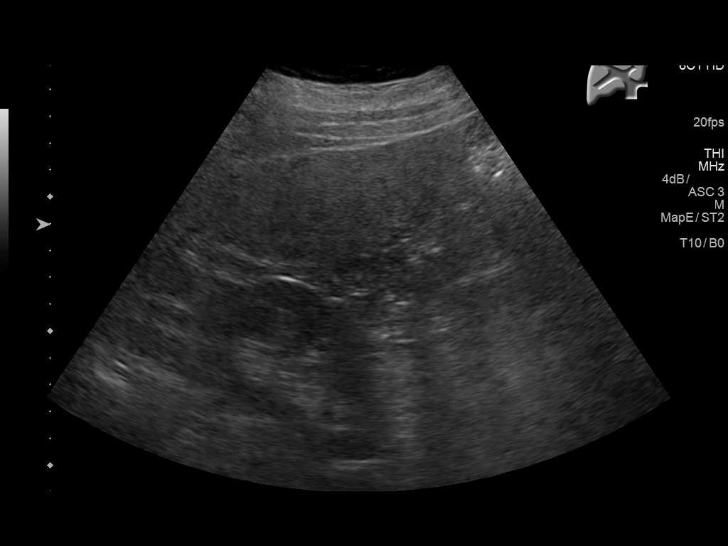
[im 44/82]
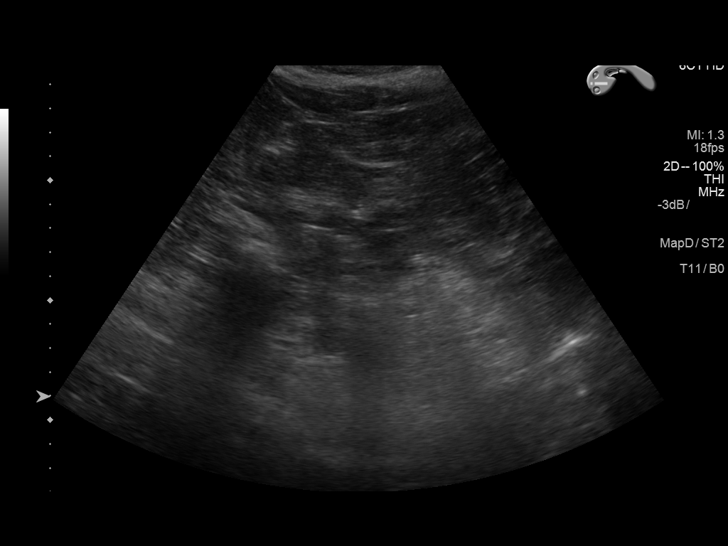
[im 51/82]
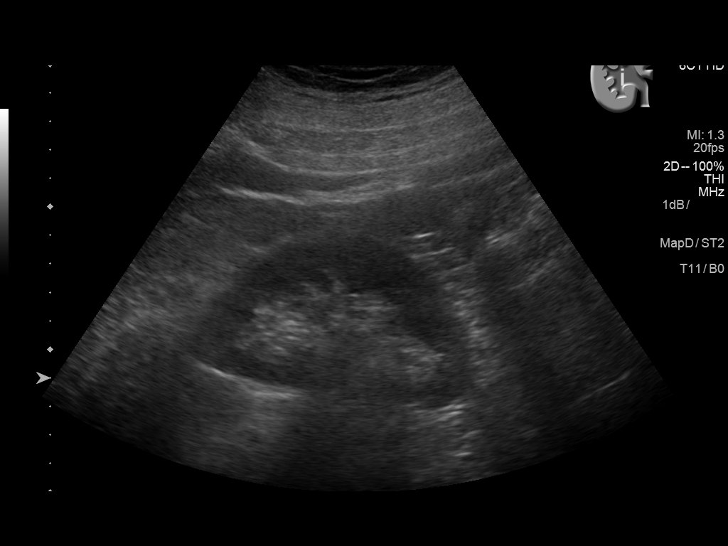
[im 55/82]
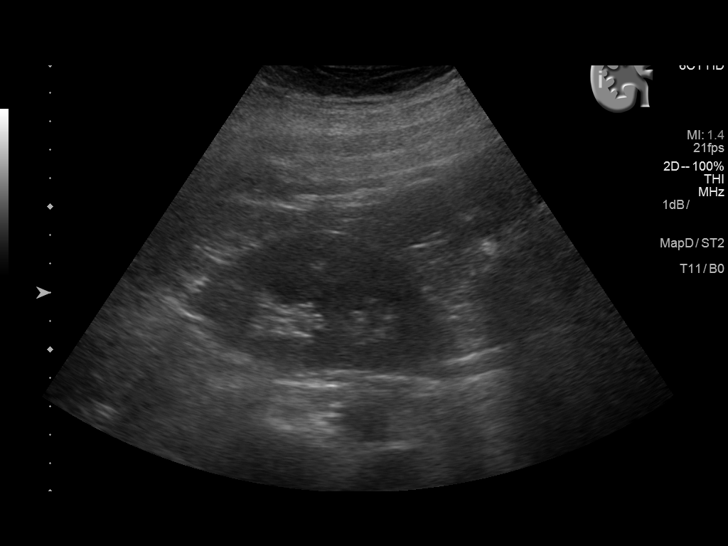
[im 61/82]
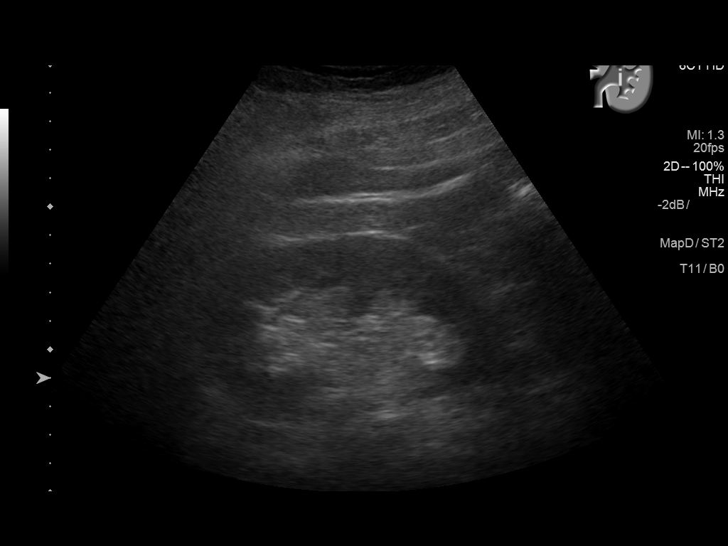
[im 68/82]
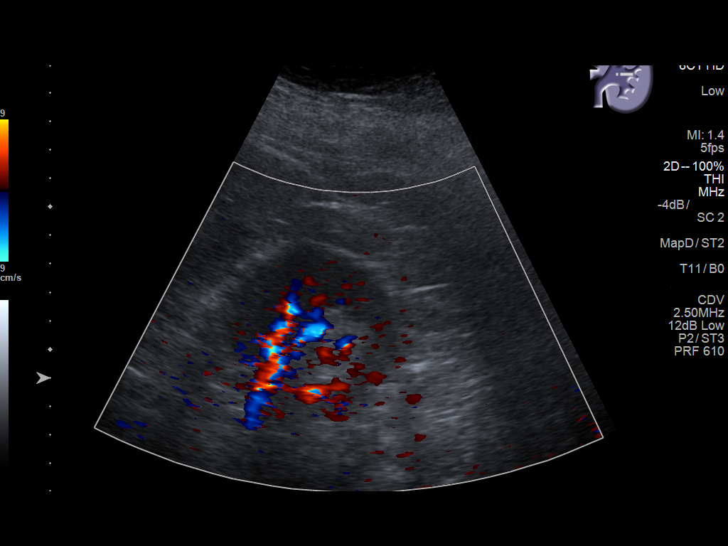
[im 75/82]
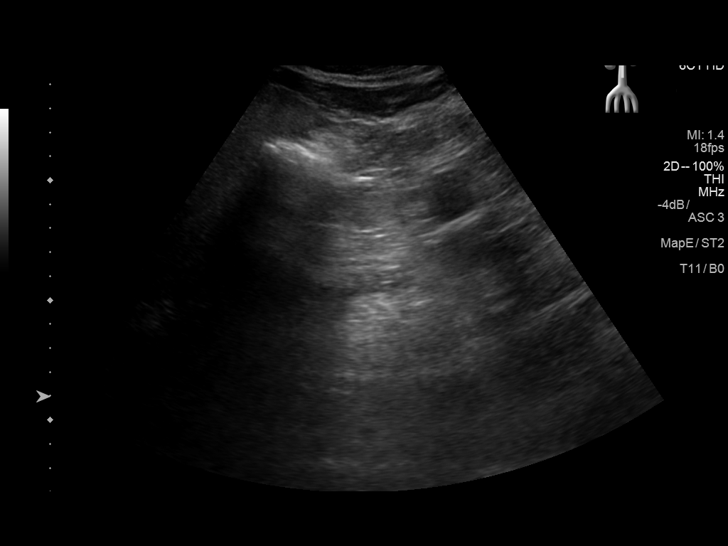
[im 82/82]
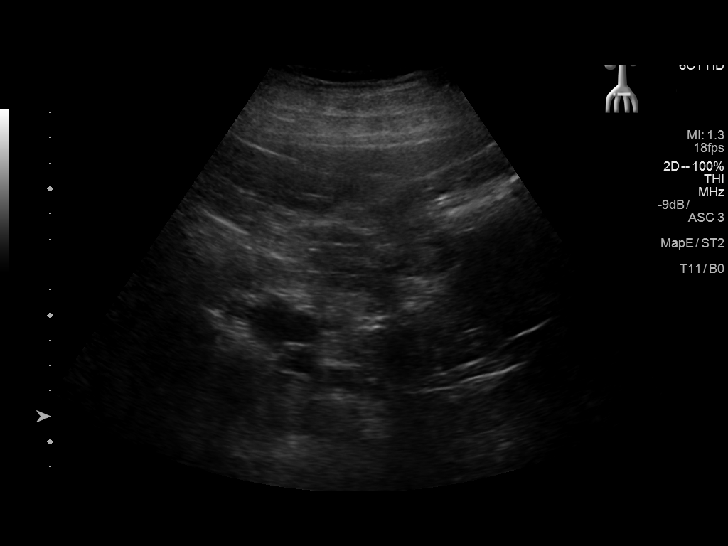

[14 of 25 positions shown; findings below may reference images not displayed]

FINDINGS: Gallbladder: Mild sludge. No gallstones or wall thickening
visualized. No sonographic Murphy sign noted by sonographer.

Common bile duct: Diameter: 4.4 mm

Liver: There is echogenic consistent fatty infiltration and/or
hepatocellular disease. Hepatic contour is slightly nodular No focal
hepatic abnormality identified.

IVC: No abnormality visualized.

Pancreas: Visualized portion unremarkable.

Spleen: Size and appearance within normal limits.

Right Kidney: Length: 9.9 cm. Echogenicity within normal limits. No
mass or hydronephrosis visualized.

Left Kidney: Length: 10.5 cm. Echogenicity within normal limits. No
mass or hydronephrosis visualized.

Abdominal aorta: No aneurysm visualized.

Other findings: None.
IMPRESSION: 1. The liver is echogenic consistent with fatty infiltration and/or
hepatocellular disease. Hepatic contour is slightly nodular.
Cirrhosis cannot be excluded.

2. Mild gallbladder sludge. No gallstones or other acute
abnormality.

## 2018-04-10 ENCOUNTER — Other Ambulatory Visit: Payer: Self-pay | Admitting: Family Medicine

## 2018-04-10 DIAGNOSIS — M79605 Pain in left leg: Secondary | ICD-10-CM

## 2018-04-10 DIAGNOSIS — I890 Lymphedema, not elsewhere classified: Secondary | ICD-10-CM

## 2018-04-16 ENCOUNTER — Ambulatory Visit: Payer: Medicare Other

## 2018-04-18 ENCOUNTER — Ambulatory Visit
Admission: RE | Admit: 2018-04-18 | Discharge: 2018-04-18 | Disposition: A | Discharge status: Home / Self Care | Payer: Medicare Other | Source: Ambulatory Visit | Attending: Family Medicine | Admitting: Family Medicine

## 2018-05-06 ENCOUNTER — Encounter (INDEPENDENT_AMBULATORY_CARE_PROVIDER_SITE_OTHER): Payer: Medicare Other | Admitting: Vascular Surgery

## 2018-05-23 ENCOUNTER — Encounter (INDEPENDENT_AMBULATORY_CARE_PROVIDER_SITE_OTHER): Payer: Medicare Other | Admitting: Vascular Surgery

## 2018-07-14 ENCOUNTER — Other Ambulatory Visit: Payer: Self-pay | Admitting: Family Medicine

## 2018-07-14 DIAGNOSIS — Z1231 Encounter for screening mammogram for malignant neoplasm of breast: Secondary | ICD-10-CM

## 2018-07-25 ENCOUNTER — Other Ambulatory Visit: Payer: Self-pay

## 2018-07-25 ENCOUNTER — Inpatient Hospital Stay
Admission: EM | Admit: 2018-07-25 | Discharge: 2018-07-28 | DRG: 641 | Disposition: A | Discharge status: Home Health | Payer: Medicare Other | Attending: Family Medicine | Admitting: Family Medicine

## 2018-07-25 ENCOUNTER — Encounter: Payer: Self-pay | Admitting: Emergency Medicine

## 2018-07-25 ENCOUNTER — Emergency Department: Payer: Medicare Other

## 2018-07-25 DIAGNOSIS — D124 Benign neoplasm of descending colon: Secondary | ICD-10-CM

## 2018-07-25 DIAGNOSIS — Z6836 Body mass index (BMI) 36.0-36.9, adult: Secondary | ICD-10-CM | POA: Diagnosis not present

## 2018-07-25 DIAGNOSIS — I4510 Unspecified right bundle-branch block: Secondary | ICD-10-CM | POA: Diagnosis present

## 2018-07-25 DIAGNOSIS — E46 Unspecified protein-calorie malnutrition: Secondary | ICD-10-CM | POA: Diagnosis present

## 2018-07-25 DIAGNOSIS — R531 Weakness: Secondary | ICD-10-CM

## 2018-07-25 DIAGNOSIS — K5730 Diverticulosis of large intestine without perforation or abscess without bleeding: Secondary | ICD-10-CM

## 2018-07-25 DIAGNOSIS — D529 Folate deficiency anemia, unspecified: Secondary | ICD-10-CM | POA: Diagnosis present

## 2018-07-25 DIAGNOSIS — E876 Hypokalemia: Secondary | ICD-10-CM | POA: Diagnosis present

## 2018-07-25 DIAGNOSIS — Z79899 Other long term (current) drug therapy: Secondary | ICD-10-CM

## 2018-07-25 DIAGNOSIS — K3189 Other diseases of stomach and duodenum: Secondary | ICD-10-CM | POA: Diagnosis not present

## 2018-07-25 DIAGNOSIS — D120 Benign neoplasm of cecum: Secondary | ICD-10-CM

## 2018-07-25 DIAGNOSIS — E878 Other disorders of electrolyte and fluid balance, not elsewhere classified: Secondary | ICD-10-CM | POA: Diagnosis present

## 2018-07-25 DIAGNOSIS — I10 Essential (primary) hypertension: Secondary | ICD-10-CM | POA: Diagnosis present

## 2018-07-25 DIAGNOSIS — D649 Anemia, unspecified: Secondary | ICD-10-CM | POA: Diagnosis not present

## 2018-07-25 DIAGNOSIS — K7030 Alcoholic cirrhosis of liver without ascites: Secondary | ICD-10-CM | POA: Diagnosis present

## 2018-07-25 DIAGNOSIS — F1020 Alcohol dependence, uncomplicated: Secondary | ICD-10-CM | POA: Diagnosis present

## 2018-07-25 DIAGNOSIS — F1010 Alcohol abuse, uncomplicated: Secondary | ICD-10-CM

## 2018-07-25 LAB — BASIC METABOLIC PANEL
Anion gap: 18 — ABNORMAL HIGH (ref 5–15)
Anion gap: 9 (ref 5–15)
BUN: 6 mg/dL — AB (ref 8–23)
BUN: 5 mg/dL — ABNORMAL LOW (ref 8–23)
CHLORIDE: 81 mmol/L — AB (ref 98–111)
CO2: 35 mmol/L — ABNORMAL HIGH (ref 22–32)
CO2: 39 mmol/L — AB (ref 22–32)
CREATININE: 0.53 mg/dL (ref 0.44–1.00)
Calcium: 6.3 mg/dL — CL (ref 8.9–10.3)
Calcium: 6.1 mg/dL — CL (ref 8.9–10.3)
Chloride: 87 mmol/L — ABNORMAL LOW (ref 98–111)
Creatinine, Ser: 0.4 mg/dL — ABNORMAL LOW (ref 0.44–1.00)
GFR calc Af Amer: >60 mL/min (ref >60)
GFR calc Af Amer: >60 mL/min (ref >60)
GFR calc non Af Amer: >60 mL/min (ref >60)
GLUCOSE: 101 mg/dL — AB (ref 70–99)
Glucose, Bld: 105 mg/dL — ABNORMAL HIGH (ref 70–99)
POTASSIUM: 2.6 mmol/L — AB (ref 3.5–5.1)
Potassium: <2 mmol/L — CL (ref 3.5–5.1)
SODIUM: 134 mmol/L — AB (ref 135–145)
Sodium: 135 mmol/L (ref 135–145)

## 2018-07-25 LAB — PHOSPHORUS: Phosphorus: 1.2 mg/dL — ABNORMAL LOW (ref 2.5–4.6)

## 2018-07-25 LAB — RETICULOCYTES
IMMATURE RETIC FRACT: 11.6 % (ref 2.3–15.9)
RBC.: 2.42 MIL/uL — AB (ref 3.87–5.11)
Retic Count, Absolute: 15.5 10*3/uL — ABNORMAL LOW (ref 19.0–186.0)
Retic Ct Pct: 0.6 % (ref 0.4–3.1)

## 2018-07-25 LAB — CBC
HEMATOCRIT: 20.1 % — AB (ref 36.0–46.0)
Hemoglobin: 6.7 g/dL — ABNORMAL LOW (ref 12.0–15.0)
MCH: 29.9 pg (ref 26.0–34.0)
MCHC: 33.3 g/dL (ref 30.0–36.0)
MCV: 89.7 fL (ref 80.0–100.0)
Platelets: 223 10*3/uL (ref 150–400)
RBC: 2.24 MIL/uL — ABNORMAL LOW (ref 3.87–5.11)
RDW: 16.4 % — AB (ref 11.5–15.5)
WBC: 5.1 10*3/uL (ref 4.0–10.5)
nRBC: 0 % (ref 0.0–0.2)

## 2018-07-25 LAB — HEPATIC FUNCTION PANEL
ALT: 7 U/L (ref 0–44)
AST: 23 U/L (ref 15–41)
Albumin: 2.9 g/dL — ABNORMAL LOW (ref 3.5–5.0)
Alkaline Phosphatase: 59 U/L (ref 38–126)
BILIRUBIN INDIRECT: 1.2 mg/dL — AB (ref 0.3–0.9)
Bilirubin, Direct: 0.4 mg/dL — ABNORMAL HIGH (ref 0.0–0.2)
TOTAL PROTEIN: 6.8 g/dL (ref 6.5–8.1)
Total Bilirubin: 1.6 mg/dL — ABNORMAL HIGH (ref 0.3–1.2)

## 2018-07-25 LAB — AMMONIA

## 2018-07-25 LAB — PREPARE RBC (CROSSMATCH)

## 2018-07-25 LAB — HEMOGLOBIN AND HEMATOCRIT, BLOOD
HEMATOCRIT: 21.7 % — AB (ref 36.0–46.0)
HEMOGLOBIN: 7.4 g/dL — AB (ref 12.0–15.0)

## 2018-07-25 LAB — PROTIME-INR
INR: 1.1
PROTHROMBIN TIME: 14.1 s (ref 11.4–15.2)

## 2018-07-25 LAB — ABO/RH: ABO/RH(D): B POS

## 2018-07-25 MED ORDER — LORAZEPAM 2 MG/ML IJ SOLN: 1.0000 mg | Freq: Four times a day (QID) | INTRAMUSCULAR | Status: DC | PRN

## 2018-07-25 MED ORDER — ONDANSETRON HCL 4 MG/2ML IJ SOLN: 4.0000 mg | Freq: Four times a day (QID) | INTRAMUSCULAR | Status: DC | PRN

## 2018-07-25 MED ORDER — PEG 3350-KCL-NA BICARB-NACL 420 G PO SOLR
4000.0000 mL | Freq: Once | ORAL | Status: AC
  Administered 2018-07-26: 4000 mL via ORAL
  Filled 2018-07-25 (×2): qty 4000

## 2018-07-25 MED ORDER — CALCIUM GLUCONATE 10 % IV SOLN
1.0000 g | Freq: Once | INTRAVENOUS | Status: AC
  Administered 2018-07-25: 1 g via INTRAVENOUS
  Filled 2018-07-25: qty 10

## 2018-07-25 MED ORDER — POTASSIUM CHLORIDE 10 MEQ/100ML IV SOLN
10.0000 meq | INTRAVENOUS | Status: AC
  Administered 2018-07-25 (×4): 10 meq via INTRAVENOUS
  Filled 2018-07-25 (×4): qty 100

## 2018-07-25 MED ORDER — ACETAMINOPHEN 325 MG PO TABS: 650.0000 mg | ORAL_TABLET | Freq: Four times a day (QID) | ORAL | Status: DC | PRN

## 2018-07-25 MED ORDER — THIAMINE HCL 100 MG/ML IJ SOLN
100.0000 mg | Freq: Every day | INTRAMUSCULAR | Status: DC
  Administered 2018-07-25 – 2018-07-26 (×2): 100 mg via INTRAVENOUS
  Filled 2018-07-25 (×4): qty 1

## 2018-07-25 MED ORDER — FOLIC ACID 1 MG PO TABS
1.0000 mg | ORAL_TABLET | Freq: Every day | ORAL | Status: DC
  Filled 2018-07-25: qty 1

## 2018-07-25 MED ORDER — SODIUM CHLORIDE 0.9 % IV SOLN
INTRAVENOUS | Status: DC
  Administered 2018-07-28: 11:00:00 via INTRAVENOUS

## 2018-07-25 MED ORDER — ADULT MULTIVITAMIN W/MINERALS CH
1.0000 | ORAL_TABLET | Freq: Every day | ORAL | Status: DC
  Administered 2018-07-27 – 2018-07-28 (×2): 1 via ORAL
  Filled 2018-07-25 (×3): qty 1

## 2018-07-25 MED ORDER — SODIUM CHLORIDE 0.9% IV SOLUTION: Freq: Once | INTRAVENOUS | Status: DC

## 2018-07-25 MED ORDER — ACETAMINOPHEN 650 MG RE SUPP: 650.0000 mg | Freq: Four times a day (QID) | RECTAL | Status: DC | PRN

## 2018-07-25 MED ORDER — ONDANSETRON HCL 4 MG PO TABS: 4.0000 mg | ORAL_TABLET | Freq: Four times a day (QID) | ORAL | Status: DC | PRN

## 2018-07-25 MED ORDER — POLYETHYLENE GLYCOL 3350 17 G PO PACK: 17.0000 g | PACK | Freq: Every day | ORAL | Status: DC | PRN

## 2018-07-25 MED ORDER — LORAZEPAM 1 MG PO TABS: 1.0000 mg | ORAL_TABLET | Freq: Four times a day (QID) | ORAL | Status: DC | PRN

## 2018-07-25 MED ORDER — POTASSIUM CHLORIDE CRYS ER 20 MEQ PO TBCR
40.0000 meq | EXTENDED_RELEASE_TABLET | Freq: Once | ORAL | Status: AC
  Administered 2018-07-25: 40 meq via ORAL
  Filled 2018-07-25: qty 2

## 2018-07-25 MED ORDER — MAGNESIUM SULFATE 2 GM/50ML IV SOLN
2.0000 g | Freq: Once | INTRAVENOUS | Status: AC
  Administered 2018-07-25: 2 g via INTRAVENOUS
  Filled 2018-07-25: qty 50

## 2018-07-25 MED ORDER — SODIUM CHLORIDE 0.9 % IV SOLN
10.0000 mL/h | Freq: Once | INTRAVENOUS | Status: AC
  Administered 2018-07-25: 10 mL/h via INTRAVENOUS

## 2018-07-25 MED ORDER — VITAMIN B-1 100 MG PO TABS
100.0000 mg | ORAL_TABLET | Freq: Every day | ORAL | Status: DC
  Administered 2018-07-27 – 2018-07-28 (×2): 100 mg via ORAL
  Filled 2018-07-25 (×2): qty 1

## 2018-07-25 MED ORDER — POTASSIUM CHLORIDE IN NACL 20-0.9 MEQ/L-% IV SOLN
INTRAVENOUS | Status: DC
  Administered 2018-07-25: 21:00:00 via INTRAVENOUS
  Filled 2018-07-25 (×2): qty 1000

## 2018-07-25 NOTE — Consult Note (Signed)
[place] Nurse wound consult note Patient receiving care in [facility]. Reason for Consult: Bilateral lower legs Wound type:  Today there are no open wounds, no weeping.  BLE are consistent in appearance with long standing venous stasis.  The skin is thickened, darkened, profoundly dry and flaking. Plan of care:  Wash with warm water, soap each shift.  Pat dry.  Apply liberal amounts of [place] Moisturizer and massage in. Thank you for the consult.  Discussed plan of care with the patient and bedside nurse.  [place] nurse will not follow at this time.  Please re-consult the [place] team if needed.  Val Riles, RN, MSN, CWOCN, CNS-BC, pager [phone]

## 2018-07-25 NOTE — Progress Notes (Signed)
CRITICAL VALUE ALERT  Critical Value:  K+ 2.6  Date & Time Notied:  07/25/2018 2333  Provider Notified: Amelia Jo, MD  Orders Received/Actions taken: IV replacement of electrolytes and will continue to monitor

## 2018-07-25 NOTE — ED Notes (Signed)
Call received from lab [place]. Critcal labs calcium 6.3 and potassium 2.6. Dr Quentin Cornwall aware of critical values .

## 2018-07-25 NOTE — ED Notes (Signed)
Pt unable to urinate at this time.  

## 2018-07-25 NOTE — Progress Notes (Signed)
PT Cancellation Note  Patient Details Name: Morgan Mcpherson MRN: [id number] DOB: [birth year]   Cancelled Treatment:    Reason Eval/Treat Not Completed: Medical issues which prohibited therapy(K+ out of safe range at this time; will follow remotely, evaluate when appropriate. )  1:42 PM, 07/25/18 Etta Grandchild, PT, DPT Physical Therapist - [facility]  [phone] ([place])     Buccola,Allan C 07/25/2018, 1:42 PM

## 2018-07-25 NOTE — ED Provider Notes (Signed)
[facility] Emergency Department Provider Note    First MD Initiated Contact with Patient 07/25/18 1039     (approximate)  I have reviewed the triage vital signs and the nursing notes.   HISTORY  Chief Complaint Weakness    HPI Anjelika Ausburn is a 72 y.o. female presents the ER with chief complaint of 1 week of worsening weakness difficulty ambulating decreased appetite.  Denies any pain.  No fevers.  No cough or shortness of breath.  Patient denies any past medical history but on review of care everywhere she was recently seen in the outpatient setting and diagnosed with alcoholic cirrhosis.  She is not on any other medications.  Denies any trauma.  Says she does have a known history of anemia.  Denies any melena or hematochezia.  No emesis.    Past Medical History:  Diagnosis Date  . Cellulitis, leg   . Hypertension    Family History  Problem Relation Age of Onset  . Cancer Mother        unknown primary   History reviewed. No pertinent surgical history. Patient Active Problem List   Diagnosis Date Noted  . 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 Thrombocytopenia ([place]) 07/11/2016  . Anemia due to chronic illness 07/11/2016  . 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 Other pancytopenia ([place]) 06/27/2016  . Rectal discomfort 06/27/2016  . Preventive measure 06/27/2016      Prior to Admission medications   Medication Sig Start Date End Date Taking? Authorizing Provider  NEOMYCIN-POLYMYXIN-HYDROCORTISONE (CORTISPORIN) 1 % SOLN otic solution Place 1 drop into the left ear 4 (four) times daily. 02/23/15   [provider]    Allergies Patient has no known allergies.    Social History Social History   Tobacco Use  . Smoking status: Never Smoker  . Smokeless tobacco: Never Used  Substance Use Topics  . Alcohol use: Not Currently    Alcohol/week: 0.0 standard drinks    Comment: quit 06/2018  . Drug use: No     Review of Systems Patient denies headaches, rhinorrhea, blurry vision, numbness, shortness of breath, chest pain, edema, cough, abdominal pain, nausea, vomiting, diarrhea, dysuria, fevers, rashes or hallucinations unless otherwise stated above in HPI. ____________________________________________   PHYSICAL EXAM:  VITAL SIGNS: Vitals:   07/25/18 1203 07/25/18 1230  BP:  (!) 109/53  Pulse:  90  Resp:  15  Temp:    SpO2: 99% 100%    Constitutional: Alert and oriented.  Eyes: Conjunctivae are normal.  Head: Atraumatic. Nose: No congestion/rhinnorhea. Mouth/Throat: Mucous membranes are moist.   Neck: No stridor. Painless ROM.  Cardiovascular: Normal rate, regular rhythm. Grossly normal heart sounds.  Good peripheral circulation. Respiratory: Normal respiratory effort.  No retractions. Lungs CTAB. Gastrointestinal: Soft and nontender. No distention. No abdominal bruits. No CVA tenderness. Genitourinary: negative rectal guaiac Musculoskeletal: No lower extremity tenderness nor edema.  No joint effusions. Neurologic:  Normal speech and language. No gross focal neurologic deficits are appreciated. No facial droop Skin:  Skin is warm, dry and intact. No rash noted. Psychiatric: Mood and affect are normal. Speech and behavior are normal.  ____________________________________________   LABS (all labs ordered are listed, but only abnormal results are displayed)  Results for orders placed or performed during the hospital encounter of 07/25/18 (from the past 24 hour(s))  Basic metabolic panel     Status: Abnormal   Collection Time: 07/25/18  9:54 AM  Result Value Ref Range   Sodium 134 (  L) 135 - 145 mmol/L   Potassium <2.0 (LL) 3.5 - 5.1 mmol/L   Chloride 81 (L) 98 - 111 mmol/L   CO2 35 (H) 22 - 32 mmol/L   Glucose, Bld 105 (H) 70 - 99 mg/dL   BUN 6 (L) 8 - 23 mg/dL   Creatinine, Ser 0.53 0.44 - 1.00 mg/dL   Calcium 6.3 (LL) 8.9 - 10.3 mg/dL   GFR calc non Af Amer >60 >60  mL/min   GFR calc Af Amer >60 >60 mL/min   Anion gap 18 (H) 5 - 15  CBC     Status: Abnormal   Collection Time: 07/25/18  9:54 AM  Result Value Ref Range   WBC 5.1 4.0 - 10.5 K/uL   RBC 2.24 (L) 3.87 - 5.11 MIL/uL   Hemoglobin 6.7 (L) 12.0 - 15.0 g/dL   HCT 20.1 (L) 36.0 - 46.0 %   MCV 89.7 80.0 - 100.0 fL   MCH 29.9 26.0 - 34.0 pg   MCHC 33.3 30.0 - 36.0 g/dL   RDW 16.4 (H) 11.5 - 15.5 %   Platelets 223 150 - 400 K/uL   nRBC 0.0 0.0 - 0.2 %  Hepatic function panel     Status: Abnormal   Collection Time: 07/25/18  9:54 AM  Result Value Ref Range   Total Protein 6.8 6.5 - 8.1 g/dL   Albumin 2.9 (L) 3.5 - 5.0 g/dL   AST 23 15 - 41 U/L   ALT 7 0 - 44 U/L   Alkaline Phosphatase 59 38 - 126 U/L   Total Bilirubin 1.6 (H) 0.3 - 1.2 mg/dL   Bilirubin, Direct 0.4 (H) 0.0 - 0.2 mg/dL   Indirect Bilirubin 1.2 (H) 0.3 - 0.9 mg/dL  Prepare RBC     Status: None (Preliminary result)   Collection Time: 07/25/18 10:51 AM  Result Value Ref Range   Order Confirmation PENDING   Type and screen [place]     Status: None (Preliminary result)   Collection Time: 07/25/18 11:34 AM  Result Value Ref Range   ABO/RH(D) PENDING    Antibody Screen PENDING    Sample Expiration      07/28/2018 Performed at [facility], [address] [place], [place] [address]   Ammonia     Status: Abnormal   Collection Time: 07/25/18 11:34 AM  Result Value Ref Range   Ammonia <9 (L) 9 - 35 umol/L  Protime-INR     Status: None   Collection Time: 07/25/18 11:34 AM  Result Value Ref Range   Prothrombin Time 14.1 11.4 - 15.2 seconds   INR 1.10    ____________________________________________  EKG My review and personal interpretation at Time: 9:59   Indication: weakness  Rate: 90  Rhythm: sinus Axis: right Other: normal intervals, no stemi ____________________________________________  RADIOLOGY   ____________________________________________   PROCEDURES  Procedure(s)  performed:  .Critical Care Performed by: Merlyn Lot, MD Authorized by: Merlyn Lot, MD   Critical care provider statement:    Critical care time (minutes):  45   Critical care time was exclusive of:  Separately billable procedures and treating other patients   Critical care was necessary to treat or prevent imminent or life-threatening deterioration of the following conditions:  Hepatic failure and metabolic crisis   Critical care was time spent personally by me on the following activities:  Development of treatment plan with patient or surrogate, discussions with consultants, evaluation of patient's response to treatment, examination of patient, obtaining  history from patient or surrogate, ordering and performing treatments and interventions, ordering and review of laboratory studies, ordering and review of radiographic studies, pulse oximetry, re-evaluation of patient's condition and review of old [place] performed: yes ____________________________________________   INITIAL IMPRESSION / ASSESSMENT AND PLAN / ED COURSE  Pertinent labs & imaging results that were available during my care of the patient were reviewed by me and considered in my medical decision making (see chart for details).   DDX: cirrhosis, electrolyte abn, anemia, dehydration, uti, chf  Julena Barbour is a 72 y.o. who presents to the ED with weakness and symptoms as described above.  Past medical history consistent with alcoholic cirrhotic but patient is not on any medications.  Noted to have acute hypokalemia as well as hypocalcemia and anemia.  No previous baseline labs to compare to and given her symptoms I do suspect suspect that this is acute.  Will provide a calcium and magnesium replenishment.  LFTs are stable.  Given her weakness we will also transfuse 1 unit of blood.  No evidence of GI bleed.  Likely secondary to anemia of chronic disease.    Clinical Course as of Jul 25 1242  Fri  Jul 25, 2018  1240 Protime-INR [PR]    Clinical Course User Index [PR] Merlyn Lot, MD     As part of my medical decision making, I reviewed the following data within the [place] notes reviewed and incorporated, Labs reviewed, notes from prior ED visits.   ____________________________________________   FINAL CLINICAL IMPRESSION(S) / ED DIAGNOSES  Final diagnoses:  Generalized weakness  Acute hypokalemia  Hypocalcemia  Alcoholic cirrhosis, unspecified whether ascites present (HCC)      NEW MEDICATIONS STARTED DURING THIS VISIT:  New Prescriptions   No medications on file     Note:  This document was prepared using Dragon voice recognition software and may include unintentional dictation errors.    Merlyn Lot, MD 07/25/18 1243

## 2018-07-25 NOTE — H&P (Signed)
[place] [facility] [place] NAME: Morgan Mcpherson    MR#:  [id number]  DATE OF BIRTH:  1945-10-13  DATE OF ADMISSION:  07/25/2018  PRIMARY CARE PHYSICIAN: Donnie Coffin, MD   REQUESTING/REFERRING PHYSICIAN: Dr. Quentin Cornwall  CHIEF COMPLAINT:   Weakness HISTORY OF PRESENT ILLNESS:  Morgan Mcpherson  is a 72 y.o. female with a known history of EtOH abuse who stopped drinking alcohol about 3 weeks ago presents to the ER due to generalized weakness. Patient that she has been a heavy drinker for several years however does not quantify how many years nor does she quantify how many drinks however about 3 weeks ago she decided that she want to quit smoking.  She denies going through EtOH withdrawal.  She was seen at a health care facility in the past and diagnosed with alcohol cirrhosis.  She does not take any other medications.  She presents today due to generalized weakness and decreased appetite.  She is unable to ambulate.  She has no focal deficits.  In the ER it is noted she has severe electrolyte abnormalities along with anemia.  She denies rectal bleeding, hematochezia, hematemesis, melena.  She denies abdominal pain. PAST MEDICAL HISTORY:   Past Medical History:  Diagnosis Date  . Cellulitis, leg   . Hypertension     PAST SURGICAL HISTORY:  None  SOCIAL HISTORY:   Social History   Tobacco Use  . Smoking status: Never Smoker  . Smokeless tobacco: Never Used  Substance Use Topics  .  She quit drinking 3 weeks ago and does not quantify how long and how many drinks she used to drink but she says I was a heavy drinker              FAMILY HISTORY:   Family History  Problem Relation Age of Onset  . Cancer Mother        unknown primary    DRUG ALLERGIES:  No Known Allergies  REVIEW OF SYSTEMS:   Review of Systems  Constitutional: Positive for malaise/fatigue. Negative for chills and fever.  HENT: Negative.  Negative for ear discharge, ear  pain, hearing loss, nosebleeds and sore throat.   Eyes: Negative.  Negative for blurred vision and pain.  Respiratory: Negative.  Negative for cough, hemoptysis, shortness of breath and wheezing.   Cardiovascular: Negative.  Negative for chest pain, palpitations and leg swelling.  Gastrointestinal: Negative.  Negative for abdominal pain, blood in stool, diarrhea, nausea and vomiting.  Genitourinary: Negative.  Negative for dysuria.  Musculoskeletal: Negative.  Negative for back pain.  Skin:       Severe dry skin  Neurological: Negative for dizziness, tremors, speech change, focal weakness, seizures and headaches.  Endo/Heme/Allergies: Negative.  Does not bruise/bleed easily.  Psychiatric/Behavioral: Negative.  Negative for depression, hallucinations and suicidal ideas.    MEDICATIONS AT HOME:   Prior to Admission medications   Medication Sig Start Date End Date Taking? Authorizing Provider  NEOMYCIN-POLYMYXIN-HYDROCORTISONE (CORTISPORIN) 1 % SOLN otic solution Place 1 drop into the left ear 4 (four) times daily. 02/23/15   [provider]      VITAL SIGNS:  Blood pressure (!) 103/56, pulse 90, temperature 99 F (37.2 C), temperature source Oral, resp. rate 11, height 5\' 2"  (1.575 m), weight 90.7 kg, SpO2 99 %.  PHYSICAL EXAMINATION:   Physical Exam  Constitutional: She is oriented to person, place, and time. No distress.  HENT:  Head: Normocephalic.  Eyes: No scleral  icterus.  Neck: Normal range of motion. Neck supple. No JVD present. No tracheal deviation present.  Cardiovascular: Normal rate, regular rhythm and normal heart sounds. Exam reveals no gallop and no friction rub.  No murmur heard. Pulmonary/Chest: Effort normal and breath sounds normal. No respiratory distress. She has no wheezes. She has no rales. She exhibits no tenderness.  Abdominal: Soft. Bowel sounds are normal. She exhibits no distension and no mass. There is no tenderness. There is no rebound and no  guarding.  Musculoskeletal: Normal range of motion. She exhibits no edema.  Neurological: She is alert and oriented to person, place, and time.  Skin: Skin is warm. No rash noted. No erythema.  Severely bilateral lower extremity dry skin  Psychiatric: Judgment normal.      LABORATORY PANEL:   CBC Recent Labs  Lab 07/25/18 0954  WBC 5.1  HGB 6.7*  HCT 20.1*  PLT 223   ------------------------------------------------------------------------------------------------------------------  Chemistries  Recent Labs  Lab 07/25/18 0954  NA 134*  K <2.0*  CL 81*  CO2 35*  GLUCOSE 105*  BUN 6*  CREATININE 0.53  CALCIUM 6.3*  AST 23  ALT 7  ALKPHOS 59  BILITOT 1.6*   ------------------------------------------------------------------------------------------------------------------  Cardiac Enzymes No results for input(s): TROPONINI in the last 168 hours. ------------------------------------------------------------------------------------------------------------------  RADIOLOGY:  Dg Chest Portable 1 View  Result Date: 07/25/2018 CLINICAL DATA:  Weakness.  Evaluate for consolidation or edema EXAM: PORTABLE CHEST 1 VIEW COMPARISON:  01/10/2014 FINDINGS: Hazy density at the right base is likely from a diaphragmatic lobulation and was also seen previously. Chronic cardiomegaly accentuated by fat based on 2018 abdominal CT. There is no edema, consolidation, effusion, or pneumothorax. IMPRESSION: 1. No acute finding or change from 2015. 2. Cardiomegaly. Electronically Signed   By: Monte Fantasia M.D.   On: 07/25/2018 12:57    EKG:   Normal sinus rhythm with right bundle blanch block no ST elevation or depression IMPRESSION AND PLAN:   72 year old female with a history of EtOH abuse who presents to the ER due to generalized weakness and found to have significant abnormalities in electrolytes along with anemia.  1.  Electrolyte abnormalities: These need to be repleted carefully  and monitored closely.  due to the fact the patient has had poor p.o. intake for several weeks and is at risk for refeeding syndrome I will consult dietary. Pharmacy consultation for electrolyte replacement  2.  Acute anemia: I have consulted GI via epic Anemia panel ordered Patient will have 1 unit PRBC Follow hemoglobin after this.  3.  EtOH dependence: Patient quit drinking 3 weeks ago At this point I do not think she needs CIWA protocol 4.  Protein calorie malnutrition: Dietary consultation  5.  Lower extremity scaly skin: Wound care consult  6.  Generalized weakness due to anemia and electrolyte abnormalities: Physical therapy consultation All the records are reviewed and case discussed with ED provider. Management plans discussed with the patient and she is in agreement  CODE STATUS: Full  TOTAL TIME TAKING CARE OF THIS PATIENT: 48 minutes.    Gid Schoffstall M.D on 07/25/2018 at 1:05 PM  Between 7am to 6pm - Pager - [phone]  After 6pm go to [email or web address] - password [email or web address] [facility]  Office  [phone]  CC: Primary care physician; Donnie Coffin, MD

## 2018-07-25 NOTE — Progress Notes (Signed)
Initial Nutrition Assessment  DOCUMENTATION CODES:   Obesity unspecified  INTERVENTION:   RD will order supplements when diet advanced  Recommend IV supplementation of folic acid and thiamine until pt is able to take oral meds  Pt likely at high refeeding risk; recommend monitor K, Mg and P labs daily until stable  NUTRITION DIAGNOSIS:   Increased nutrient needs related to chronic illness(cirrhosis, etoh abuse ) as evidenced by increased estimated needs.  GOAL:   Patient will meet greater than or equal to 90% of their needs  MONITOR:   Diet advancement, Labs, Weight trends, Skin, I & O's  REASON FOR ASSESSMENT:   Consult Assessment of nutrition requirement/status  ASSESSMENT:   72 y.o. female with a known history of EtOH abuse who stopped drinking alcohol about 3 weeks ago presents to the ER due to generalized weakness   Met with pt in room today. Pt reports poor appetite and oral intake for 1 week pta. Pt does not drink supplements at home but recently purchased some Boost that is sitting in her refrigerator. Pt does not take any vitamins at home. Pt reports difficulty walking for 1 week; reports this has happened before but not this bad. Pt reports her UBW ~226lbs; pt reports a 40lb weight loss over the past few months. RD is unsure if patient has had any true weight loss; there is no recent weight history in chart to confirm and pt with edema. Pt currently NPO. Recommend IV thiamine and folic acid supplementation as thiamine deficiency can cause weakness. Pt is willing to drink Ensure once diet is advanced. Pt denies any trouble chewing or swallowing. Pt likely at high refeeding risk; pharmacy following for electrolyte management. Iron/anemia labs pending.   Medications reviewed and include: NaCl w/ KCl '@75ml'$ /hr  Labs reviewed: NA 134(L), K <2.0(L), Cl 81(L), BUN 6(L), Ca 6.3(L) adj. 7.18(L), Anion gap 18(H), alb 2.9(L), tbili 1.6(H) Hgb 6.7(L), Hct 20.1(L)  NUTRITION -  FOCUSED PHYSICAL EXAM:    Most Recent Value  Orbital Region  No depletion  Upper Arm Region  No depletion  Thoracic and Lumbar Region  No depletion  Buccal Region  No depletion  Temple Region  No depletion  Clavicle Bone Region  No depletion  Clavicle and Acromion Bone Region  No depletion  Scapular Bone Region  No depletion  Dorsal Hand  No depletion  Patellar Region  No depletion  Anterior Thigh Region  No depletion  Posterior Calf Region  No depletion  Edema (RD Assessment)  Moderate  Hair  Reviewed  Eyes  Reviewed  Mouth  Reviewed  Skin  Reviewed  Nails  Reviewed     Diet Order:   Diet Order            Diet NPO time specified  Diet effective midnight             EDUCATION NEEDS:   Education needs have been addressed  Skin:  Skin Assessment: Reviewed RN Assessment(venous stasis on BLE )  Last BM:  11/20  Height:   Ht Readings from Last 1 Encounters:  07/25/18 '5\' 2"'$  (1.575 m)    Weight:   Wt Readings from Last 1 Encounters:  07/25/18 90.7 kg    Ideal Body Weight:  50 kg  BMI:  Body mass index is 36.58 kg/m.  Estimated Nutritional Needs:   Kcal:  1800-2100kcal/day   Protein:  90-109g/day   Fluid:  1.3L/day   Koleen Distance MS, RD, LDN Pager #[phone] Office#- [phone]  After Hours Pager: [phone]

## 2018-07-25 NOTE — ED Notes (Addendum)
Dr. Benjie Karvonen removed oxygen from pt. Her sats are now remaining 93% or better. Will continue to monitor pt.

## 2018-07-25 NOTE — ED Triage Notes (Signed)
Patient from home via EMS. Reports for the last week she has had increasing weakness and decreased appetite. States she is unable to walk without her legs giving out on her. Patient reports she has also been dizzy when she stands up and her mouth has been dry. CBG for EMS 132.

## 2018-07-25 NOTE — Consult Note (Signed)
Jonathon Bellows , MD [address] [place], [place], [place], [address] [address], [place], [place], [place], [address] Phone: [phone]  Fax: [phone]  Consultation  Referring Provider:  Dr Benjie Karvonen Primary Care Physician:  Donnie Coffin, MD Primary Gastroenterologist:  [facility]          Reason for Consultation:     Anemia  Date of Admission:  07/25/2018 Date of Consultation:  07/25/2018         HPI:   Morgan Mcpherson is a 72 y.o. female has previously been evaluated by [facility].  I am not able to see the full office note on epic at this point of time.  But there is a mention of alcoholic cirrhosis.  Last office visit has been in January 2019.  He presents to the emergency room today with weakness and decreased appetite.  In the ER she was noted to have severe electrolyte abnormalities with anemia.  Denied any overt blood loss.  Admission her hemoglobin is 6.7 g and MCV of 89.  Hemoglobin 1 year back was 13.6 g.  She has a albumin of 2.9 g.  INR 1.1, platelet count of 223.  The scan of the abdomen performed in 04/22/2017 shows no acute abnormalities and a normal liver.  Denies any overt blood loss through urine,nose,mouth,vagina . Stools are brown and no NSAID use. She drinks 12 cans of beer every day for many years and says she quit 3 weeks back, at times has had nothing to eat. No other complaints. Never had an EGD or colonoscopy. No family  History of colon cancer.   Past Medical History:  Diagnosis Date  . Cellulitis, leg   . Hypertension     History reviewed. No pertinent surgical history.  Prior to Admission medications   Medication Sig Start Date End Date Taking? Authorizing Provider  NEOMYCIN-POLYMYXIN-HYDROCORTISONE (CORTISPORIN) 1 % SOLN otic solution Place 1 drop into the left ear 4 (four) times daily. 02/23/15   [provider]    Family History  Problem Relation Age of Onset  . Cancer Mother        unknown primary     Social  History   Tobacco Use  . Smoking status: Never Smoker  . Smokeless tobacco: Never Used  Substance Use Topics  . Alcohol use: Not Currently    Alcohol/week: 0.0 standard drinks    Comment: quit 06/2018  . Drug use: No    Allergies as of 07/25/2018  . (No Known Allergies)    Review of Systems:    All systems reviewed and negative except where noted in HPI.   Physical Exam:  Vital signs in last 24 hours: Temp:  [99 F (37.2 C)] 99 F (37.2 C) (11/22 0943) Pulse Rate:  [89-95] 89 (11/22 1330) Resp:  [11-18] 16 (11/22 1330) BP: (103-119)/(51-60) 107/57 (11/22 1330) SpO2:  [88 %-100 %] 96 % (11/22 1330) Weight:  [90.7 kg] 90.7 kg (11/22 0944)   General:   Pleasant, cooperative in NAD Head:  Normocephalic and atraumatic. Eyes:   No icterus.   Conjunctiva pink. PERRLA. Ears:  Normal auditory acuity. Neck:  Supple; no masses or thyroidomegaly Lungs: Respirations even and unlabored. Lungs clear to auscultation bilaterally.   No wheezes, crackles, or rhonchi.  Heart:  Regular rate and rhythm;  Without murmur, clicks, rubs or gallops Abdomen:  Soft, nondistended, nontender. Normal bowel sounds. No appreciable masses or hepatomegaly.  No rebound or guarding.  Neurologic:  Alert and  oriented x3;  grossly normal neurologically. Skin:  Intact without significant lesions or rashes. Cervical Nodes:  No significant cervical adenopathy. Psych:  Alert and cooperative. Normal affect.  LAB RESULTS: Recent Labs    07/25/18 0954  WBC 5.1  HGB 6.7*  HCT 20.1*  PLT 223   BMET Recent Labs    07/25/18 0954  NA 134*  K <2.0*  CL 81*  CO2 35*  GLUCOSE 105*  BUN 6*  CREATININE 0.53  CALCIUM 6.3*   LFT Recent Labs    07/25/18 0954  PROT 6.8  ALBUMIN 2.9*  AST 23  ALT 7  ALKPHOS 59  BILITOT 1.6*  BILIDIR 0.4*  IBILI 1.2*   PT/INR Recent Labs    07/25/18 1134  LABPROT 14.1  INR 1.10    STUDIES: Dg Chest Portable 1 View  Result Date: 07/25/2018 CLINICAL DATA:   Weakness.  Evaluate for consolidation or edema EXAM: PORTABLE CHEST 1 VIEW COMPARISON:  01/10/2014 FINDINGS: Hazy density at the right base is likely from a diaphragmatic lobulation and was also seen previously. Chronic cardiomegaly accentuated by fat based on 2018 abdominal CT. There is no edema, consolidation, effusion, or pneumothorax. IMPRESSION: 1. No acute finding or change from 2015. 2. Cardiomegaly. Electronically Signed   By: Monte Fantasia M.D.   On: 07/25/2018 12:57      Impression / Plan:   Morgan Mcpherson is a 72 y.o. y/o female with tree of alcohol abuse presents to emergency room with weakness and found to be severely anemic normocytic in nature.  No overt blood loss.  She sees [facility] in the past and prior records mention about alcoholic cirrhosis.  Imaging in 2018 does not show any evidence of cirrhosis of the liver on her CT scan, she has a normal platelet count at this point of time and is slightly lower than normal albumin level.  She has good synthetic function with an INR which is in the normal range.  She does not have overt biochemical evidence of liver cirrhosis.  Plan 1.  Stop all alcohol. 2.  Check iron studies B12 folate TSH, ferritin, urine analysis. 3.  Check the color of her stool to see if it is black in color.  Do not check stool occult as it has no relevance in evaluation of anemia. 4.  EGD/colonoscopy on Sunday to evaluate for esophageal varices.  At this point of time it is very unlikely she has had a variceal bleed as there has been no overt blood loss. 5.  If EGD/colonoscopy  is negative she can have an outpatient capsule study of the small bowel.  If iron studies are negative and endoscopy is negative then she may need to be seen by hematology for evaluation of anemia. 6.  Obtain right upper quadrant ultrasound to evaluate the liver.  I have discussed alternative options, risks & benefits,  which include, but are not limited to, bleeding, infection,  perforation,respiratory complication & drug reaction.  The patient agrees with this plan & written consent will be obtained.    Thank you for involving me in the care of this patient.      LOS: 0 days   Jonathon Bellows, MD  07/25/2018, 1:55 PM

## 2018-07-25 NOTE — Consult Note (Signed)
Pharmacy Electrolyte Monitoring Consult:  Pharmacy consulted to assist in monitoring and replacing electrolytes in this 72 y.o. female admitted on 07/25/2018 with Weakness   Labs:  Sodium (mmol/L)  Date Value  07/25/2018 134 (L)  01/14/2014 139   Potassium (mmol/L)  Date Value  07/25/2018 <2.0 (LL)  01/14/2014 3.3 (L)   Magnesium (mg/dL)  Date Value  01/14/2014 1.8   Calcium (mg/dL)  Date Value  07/25/2018 6.3 (LL)   Calcium, Total (mg/dL)  Date Value  01/14/2014 7.6 (L)   Albumin (g/dL)  Date Value  07/25/2018 2.9 (L)  01/10/2014 2.3 (L)    Assessment/Plan: Presented with hypokalemia, K < 2.0.  KCl 10 mEq IV x 4 and KCl 40 mEq PO x 1 was ordered.   Awaiting afternoon BMP.  Pharmacy will continue to monitor.  Paticia Stack, PharmD Pharmacy Resident  07/25/2018 1:00 PM

## 2018-07-25 NOTE — ED Notes (Signed)
Pt's oxygen sats dropped down to 89% while on room air. Placed her back on 2 lpm via [place]. Sats increased to 97% on supplemental oxygen.

## 2018-07-25 NOTE — Progress Notes (Signed)
CRITICAL VALUE ALERT  Critical Value:  Ca+ 6.1  Date & Time Notied:  07/25/2018 2333  Provider Notified: Amelia Jo, MD  Orders Received/Actions taken: IV replacement of electrolytes and will continue to monitor.

## 2018-07-25 NOTE — ED Notes (Addendum)
Pt's sats dropped to 89% on room air. Encouraged pt to take deep breaths. Sats remain 90%. Placed pt on 2 lpm via [place]. Sats increased to 99% on supplemental oxygen.

## 2018-07-25 NOTE — Progress Notes (Signed)
Chaplain responded to an OR for AD. Chaplain saw Pt in considerable pain. Chaplain let Pt know we will give education when she is on the floor. Pt was agreeable.    07/25/18 1300  Clinical Encounter Type  Visited With Patient and family together  Visit Type Initial  Referral From Physician  Spiritual Encounters  Spiritual Needs Brochure;Prayer

## 2018-07-25 NOTE — Progress Notes (Signed)
Family Meeting Note  Advance Directive:no  Today a meeting took place with the Patient.  The following clinical team members were present during this meeting:MD  The following were discussed:Patient's diagnosis: Severe electrolyte abnormalities with anemia and EtOH abuse with protein calorie malnutrition, Patient's progosis: > 12 months and Goals for treatment: Full Code  Additional follow-up to be provided: Chaplain consultation  to create advanced directives  Time spent during discussion: 16 minutes  Morgan Ellington, MD

## 2018-07-26 ENCOUNTER — Inpatient Hospital Stay: Payer: Medicare Other

## 2018-07-26 LAB — RENAL FUNCTION PANEL
ALBUMIN: 2.4 g/dL — AB (ref 3.5–5.0)
Anion gap: 12 (ref 5–15)
BUN: <5 mg/dL — ABNORMAL LOW (ref 8–23)
CALCIUM: 6.3 mg/dL — AB (ref 8.9–10.3)
CO2: 34 mmol/L — ABNORMAL HIGH (ref 22–32)
Chloride: 90 mmol/L — ABNORMAL LOW (ref 98–111)
Creatinine, Ser: 0.47 mg/dL (ref 0.44–1.00)
GFR calc Af Amer: >60 mL/min (ref >60)
Glucose, Bld: 111 mg/dL — ABNORMAL HIGH (ref 70–99)
PHOSPHORUS: 2.3 mg/dL — AB (ref 2.5–4.6)
POTASSIUM: 2.6 mmol/L — AB (ref 3.5–5.1)
SODIUM: 136 mmol/L (ref 135–145)

## 2018-07-26 LAB — HEMOGLOBIN AND HEMATOCRIT, BLOOD
HCT: 25.6 % — ABNORMAL LOW (ref 36.0–46.0)
Hemoglobin: 8.5 g/dL — ABNORMAL LOW (ref 12.0–15.0)

## 2018-07-26 LAB — CALCIUM: Calcium: 6.5 mg/dL — ABNORMAL LOW (ref 8.9–10.3)

## 2018-07-26 LAB — BASIC METABOLIC PANEL
ANION GAP: 8 (ref 5–15)
BUN: 6 mg/dL — AB (ref 8–23)
CHLORIDE: 88 mmol/L — AB (ref 98–111)
CO2: 39 mmol/L — ABNORMAL HIGH (ref 22–32)
Calcium: 6.4 mg/dL — CL (ref 8.9–10.3)
Creatinine, Ser: 0.35 mg/dL — ABNORMAL LOW (ref 0.44–1.00)
GFR calc Af Amer: >60 mL/min (ref >60)
GFR calc non Af Amer: >60 mL/min (ref >60)
GLUCOSE: 111 mg/dL — AB (ref 70–99)
POTASSIUM: 2.3 mmol/L — AB (ref 3.5–5.1)
Sodium: 135 mmol/L (ref 135–145)

## 2018-07-26 LAB — POTASSIUM: POTASSIUM: 3.1 mmol/L — AB (ref 3.5–5.1)

## 2018-07-26 LAB — CBC
HCT: 21.6 % — ABNORMAL LOW (ref 36.0–46.0)
Hemoglobin: 7.2 g/dL — ABNORMAL LOW (ref 12.0–15.0)
MCH: 29.8 pg (ref 26.0–34.0)
MCHC: 33.3 g/dL (ref 30.0–36.0)
MCV: 89.3 fL (ref 80.0–100.0)
PLATELETS: 184 10*3/uL (ref 150–400)
RBC: 2.42 MIL/uL — ABNORMAL LOW (ref 3.87–5.11)
RDW: 15.5 % (ref 11.5–15.5)
WBC: 5 10*3/uL (ref 4.0–10.5)
nRBC: 0 % (ref 0.0–0.2)

## 2018-07-26 LAB — FERRITIN: FERRITIN: 501 ng/mL — AB (ref 11–307)

## 2018-07-26 LAB — PHOSPHORUS
PHOSPHORUS: 1.8 mg/dL — AB (ref 2.5–4.6)
Phosphorus: 2.5 mg/dL (ref 2.5–4.6)

## 2018-07-26 LAB — FOLATE: Folate: 3.5 ng/mL — ABNORMAL LOW (ref >5.9)

## 2018-07-26 LAB — MAGNESIUM
MAGNESIUM: 1.5 mg/dL — AB (ref 1.7–2.4)
MAGNESIUM: 2.8 mg/dL — AB (ref 1.7–2.4)
MAGNESIUM: 2 mg/dL (ref 1.7–2.4)
Magnesium: 1.7 mg/dL (ref 1.7–2.4)

## 2018-07-26 LAB — IRON AND TIBC
Iron: 137 ug/dL (ref 28–170)
Saturation Ratios: 90 % — ABNORMAL HIGH (ref 10.4–31.8)
TIBC: 153 ug/dL — AB (ref 250–450)
UIBC: 16 ug/dL

## 2018-07-26 LAB — PREPARE RBC (CROSSMATCH)

## 2018-07-26 LAB — VITAMIN B12: VITAMIN B 12: 442 pg/mL (ref 180–914)

## 2018-07-26 MED ORDER — POTASSIUM CHLORIDE 20 MEQ PO PACK
60.0000 meq | PACK | Freq: Once | ORAL | Status: AC
  Administered 2018-07-26: 60 meq via ORAL
  Filled 2018-07-26: qty 3

## 2018-07-26 MED ORDER — CALCIUM GLUCONATE-NACL 1-0.675 GM/50ML-% IV SOLN
1.0000 g | Freq: Once | INTRAVENOUS | Status: AC
  Administered 2018-07-26: 02:00:00 1000 mg via INTRAVENOUS
  Filled 2018-07-26: qty 50

## 2018-07-26 MED ORDER — SPIRONOLACTONE 25 MG PO TABS
50.0000 mg | ORAL_TABLET | Freq: Every day | ORAL | Status: DC
  Administered 2018-07-26 – 2018-07-28 (×3): 50 mg via ORAL
  Filled 2018-07-26 (×3): qty 2

## 2018-07-26 MED ORDER — CALCIUM GLUCONATE-NACL 1-0.675 GM/50ML-% IV SOLN
1.0000 g | INTRAVENOUS | Status: AC
  Administered 2018-07-27 (×2): 1000 mg via INTRAVENOUS
  Filled 2018-07-26 (×2): qty 50

## 2018-07-26 MED ORDER — MAGNESIUM SULFATE 4 GM/100ML IV SOLN
4.0000 g | Freq: Once | INTRAVENOUS | Status: AC
  Administered 2018-07-26: 02:00:00 4 g via INTRAVENOUS
  Filled 2018-07-26: qty 100

## 2018-07-26 MED ORDER — POTASSIUM CHLORIDE 10 MEQ/100ML IV SOLN: 10.0000 meq | INTRAVENOUS | Status: DC

## 2018-07-26 MED ORDER — POTASSIUM PHOSPHATES 15 MMOLE/5ML IV SOLN
15.0000 mmol | Freq: Once | INTRAVENOUS | Status: AC
  Administered 2018-07-26: 15:00:00 15 mmol via INTRAVENOUS
  Filled 2018-07-26: qty 5

## 2018-07-26 MED ORDER — SODIUM CHLORIDE 0.9% IV SOLUTION
Freq: Once | INTRAVENOUS | Status: AC
  Administered 2018-07-26: 15:00:00 via INTRAVENOUS

## 2018-07-26 MED ORDER — POTASSIUM PHOSPHATES 15 MMOLE/5ML IV SOLN
45.0000 mmol | Freq: Once | INTRAVENOUS | Status: AC
  Administered 2018-07-26: 45 mmol via INTRAVENOUS
  Filled 2018-07-26: qty 15

## 2018-07-26 MED ORDER — SODIUM CHLORIDE 0.9 % IV SOLN
1.0000 mg | Freq: Every day | INTRAVENOUS | Status: DC
  Administered 2018-07-26 – 2018-07-28 (×3): 1 mg via INTRAVENOUS
  Filled 2018-07-26 (×4): qty 0.2

## 2018-07-26 MED ORDER — CALCIUM GLUCONATE-NACL 1-0.675 GM/50ML-% IV SOLN
1.0000 g | Freq: Once | INTRAVENOUS | Status: AC
  Administered 2018-07-26: 1000 mg via INTRAVENOUS
  Filled 2018-07-26: qty 50

## 2018-07-26 MED ORDER — FOLIC ACID 5 MG/ML IJ SOLN: 1.0000 mg | Freq: Every day | INTRAMUSCULAR | Status: DC

## 2018-07-26 MED ORDER — CALCIUM GLUCONATE-NACL 2-0.675 GM/100ML-% IV SOLN
2.0000 g | Freq: Once | INTRAVENOUS | Status: DC
  Filled 2018-07-26: qty 100

## 2018-07-26 MED ORDER — ACETAMINOPHEN 325 MG PO TABS: 650.0000 mg | ORAL_TABLET | Freq: Once | ORAL | Status: DC

## 2018-07-26 MED ORDER — KCL IN DEXTROSE-NACL 40-5-0.9 MEQ/L-%-% IV SOLN
INTRAVENOUS | Status: DC
  Administered 2018-07-26 – 2018-07-28 (×3): via INTRAVENOUS
  Filled 2018-07-26 (×7): qty 1000

## 2018-07-26 MED ORDER — DIPHENHYDRAMINE HCL 25 MG PO CAPS: 25.0000 mg | ORAL_CAPSULE | Freq: Once | ORAL | Status: DC

## 2018-07-26 NOTE — Plan of Care (Signed)
  Problem: Education: Goal: Knowledge of General Education information will improve Description: Including pain rating scale, medication(s)/side effects and non-pharmacologic comfort measures Outcome: Progressing   Problem: Health Behavior/Discharge Planning: Goal: Ability to manage health-related needs will improve Outcome: Progressing   Problem: Clinical Measurements: Goal: Will remain free from infection Outcome: Progressing Goal: Respiratory complications will improve Outcome: Progressing Goal: Cardiovascular complication will be avoided Outcome: Progressing   Problem: Activity: Goal: Risk for activity intolerance will decrease Outcome: Progressing   Problem: Pain Managment: Goal: General experience of comfort will improve Outcome: Progressing   Problem: Safety: Goal: Ability to remain free from injury will improve Outcome: Progressing   

## 2018-07-26 NOTE — Consult Note (Signed)
Pharmacy Electrolyte Monitoring Consult:  Pharmacy consulted to assist in monitoring and replacing electrolytes in this 72 y.o. female admitted on 07/25/2018 with weakness and h/o EtOH abuse. Patient states that she stopped drinking 3 weeks ago and denies w/d's. Patient is severely hypokalemic/phosphatemic/magnesemic hypocalcemic. Patient was ordered thiamine/FA/multivitamins as a precautionary to prevent Wernicke's and has been placed on precautionary CIWA.  Labs:  Sodium (mmol/L)  Date Value  07/26/2018 136  01/14/2014 139   Potassium (mmol/L)  Date Value  07/26/2018 2.6 (LL)  01/14/2014 3.3 (L)   Magnesium (mg/dL)  Date Value  07/26/2018 2.0  01/14/2014 1.8   Phosphorus (mg/dL)  Date Value  07/26/2018 2.3 (L)   Calcium (mg/dL)  Date Value  07/26/2018 6.3 (LL)   Calcium, Total (mg/dL)  Date Value  01/14/2014 7.6 (L)   Albumin (g/dL)  Date Value  07/26/2018 2.4 (L)  01/10/2014 2.3 (L)   11/22 @ 2236 K 2.6, Phos 1.2, Mag 1.5, Cal 6.1 (CCa 7.0). Will replace electrolytes via: - Fluids changed to D5 + NS + 40 mEq/L @ 75 ml/hr (MD notified and agrees) - Kphos 45 mmol IV x 1 over 6 hours - Mag 4g IV x 1 - Calcium gluconate IV x 1    Assessment/Plan: 11/23 labs @ 1031: K 2.6, Mag 2.0 Phos 2.3 Scr 0.47 Ca 6.3 Albumin 2.4 Albumin Corrected Calcium 7.6  MD has ordered KCL 60 meq PO x 1. Will order Calcium gluconate 1 gram IV x1. Will order Potassium Phosphate 15 mmol IV x 1.   Will f/u K and Calcium at 2100. Will monitor all electrolytes w/ am labs and will continue to monitor to replace as necessary.  Chinita Greenland PharmD Clinical Pharmacist 07/26/2018

## 2018-07-26 NOTE — Progress Notes (Signed)
[facility] [place] NAME: Morgan Mcpherson    MR#:  [id number]  DATE OF BIRTH:  28-May-1946  SUBJECTIVE:  CHIEF COMPLAINT:   Chief Complaint  Patient presents with  . Weakness   Patient without complaint, for endoscopy on Sunday per gastroenterology, follow-up on liver ultrasound, start clear liquid diet for now, n.p.o. after midnight, replete potassium REVIEW OF SYSTEMS:  CONSTITUTIONAL: No fever, fatigue or weakness.  EYES: No blurred or double vision.  EARS, NOSE, AND THROAT: No tinnitus or ear pain.  RESPIRATORY: No cough, shortness of breath, wheezing or hemoptysis.  CARDIOVASCULAR: No chest pain, orthopnea, edema.  GASTROINTESTINAL: No nausea, vomiting, diarrhea or abdominal pain.  GENITOURINARY: No dysuria, hematuria.  ENDOCRINE: No polyuria, nocturia,  HEMATOLOGY: No anemia, easy bruising or bleeding SKIN: No rash or lesion. MUSCULOSKELETAL: No joint pain or arthritis.   NEUROLOGIC: No tingling, numbness, weakness.  PSYCHIATRY: No anxiety or depression.   ROS  DRUG ALLERGIES:  No Known Allergies  VITALS:  Blood pressure 109/60, pulse 85, temperature 98.9 F (37.2 C), temperature source Oral, resp. rate 15, height 5\' 2"  (1.575 m), weight 90.7 kg, SpO2 98 %.  PHYSICAL EXAMINATION:  GENERAL:  72 y.o.-year-old patient lying in the bed with no acute distress.  EYES: Pupils equal, round, reactive to light and accommodation. No scleral icterus. Extraocular muscles intact.  HEENT: Head atraumatic, normocephalic. Oropharynx and nasopharynx clear.  NECK:  Supple, no jugular venous distention. No thyroid enlargement, no tenderness.  LUNGS: Normal breath sounds bilaterally, no wheezing, rales,rhonchi or crepitation. No use of accessory muscles of respiration.  CARDIOVASCULAR: S1, S2 normal. No murmurs, rubs, or gallops.  ABDOMEN: Soft, nontender, nondistended. Bowel sounds present. No organomegaly or mass.  EXTREMITIES: No pedal edema,  cyanosis, or clubbing.  NEUROLOGIC: Cranial nerves II through XII are intact. Muscle strength 5/5 in all extremities. Sensation intact. Gait not checked.  PSYCHIATRIC: The patient is alert and oriented x 3.  SKIN: No obvious rash, lesion, or ulcer.   Physical Exam LABORATORY PANEL:   CBC Recent Labs  Lab 07/26/18 0333  WBC 5.0  HGB 7.2*  HCT 21.6*  PLT 184   ------------------------------------------------------------------------------------------------------------------  Chemistries  Recent Labs  Lab 07/25/18 0954  07/26/18 1031  NA 134*   < > 136  K <2.0*   < > 2.6*  CL 81*   < > 90*  CO2 35*   < > 34*  GLUCOSE 105*   < > 111*  BUN 6*   < > <5*  CREATININE 0.53   < > 0.47  CALCIUM 6.3*   < > 6.3*  MG  --    < > 2.0  AST 23  --   --   ALT 7  --   --   ALKPHOS 59  --   --   BILITOT 1.6*  --   --    < > = values in this interval not displayed.   ------------------------------------------------------------------------------------------------------------------  Cardiac Enzymes No results for input(s): TROPONINI in the last 168 hours. ------------------------------------------------------------------------------------------------------------------  RADIOLOGY:  Dg Chest Portable 1 View  Result Date: 07/25/2018 CLINICAL DATA:  Weakness.  Evaluate for consolidation or edema EXAM: PORTABLE CHEST 1 VIEW COMPARISON:  01/10/2014 FINDINGS: Hazy density at the right base is likely from a diaphragmatic lobulation and was also seen previously. Chronic cardiomegaly accentuated by fat based on 2018 abdominal CT. There is no edema, consolidation, effusion, or pneumothorax. IMPRESSION: 1. No acute finding or change from 2015.  2. Cardiomegaly. Electronically Signed   By: Monte Fantasia M.D.   On: 07/25/2018 12:57   US Liver Doppler  Result Date: 07/26/2018 CLINICAL DATA:  Alcoholic liver disease. EXAM: DUPLEX ULTRASOUND OF LIVER TECHNIQUE: Color and duplex Doppler ultrasound was  performed to evaluate the hepatic in-flow and out-flow vessels. COMPARISON:  Prior abdominal ultrasound on 02/27/2017 and CT of the abdomen on 04/16/2017. FINDINGS: Portal Vein Velocities Main:  24-37 cm/sec Right:  14 cm/sec Left:  12 cm/sec Low portal vein velocities but normal direction of flow in a hepatopetal direction. Hepatic Vein Velocities Right:  17 cm/sec Middle:  53 cm/sec Left:  28 cm/sec Hepatic vein waveforms are normal without abnormal pulsatility. Hepatic Artery Velocity:  51 cm/sec Splenic Vein Velocity:  17 cm/sec Varices: None visualized by ultrasound. Ascites: No ascites identified. The liver demonstrates coarse echotexture and extremely increased echogenicity, likely reflecting at least diffuse steatosis. Surface contour is not overtly nodular. No focal lesions are identified. There is no evidence of intrahepatic biliary ductal dilatation. The portal vein demonstrates no thrombus. Normal spleen size with estimated splenic volume of approximately 207 mL. IMPRESSION: 1. Low portal vein velocities are suggestive of some degree of portal hypertension. However, no ascites or splenomegaly identified. There is no evidence of portal vein thrombus. 2. The liver at self is extremely echogenic, likely reflecting at least diffuse steatosis. Component of fibrosis may be present. Electronically Signed   By: Aletta Edouard M.D.   On: 07/26/2018 11:15    ASSESSMENT AND PLAN:  72 year old female with a history of EtOH abuse who presents to the ER due to generalized weakness and found to have significant abnormalities in electrolytes along with anemia.  *Acute severe hypokalemia, hypochloremia  Replete with p.o. potassium, magnesium level was normal, spironolactone daily   *Acute anemia  Anemia work-up consistent with folate deficiency For endoscopy on Sunday per gastroenterology, clear liquid diet for now, n.p.o. after midnight  *Chronic EtOH dependence Continue alcohol withdrawal  protocol  *Chronic protein calorie malnutrition Dietary consulted  *Lower extremity scaly skin Wound care consult  *Generalized weakness  due to anemia and electrolyte abnormalities Physical therapy consulted   All the records are reviewed and case discussed with Care Management/Social Workerr. Management plans discussed with the patient, family and they are in agreement.  CODE STATUS: full  TOTAL TIME TAKING CARE OF THIS PATIENT: 40 minutes.     POSSIBLE D/C IN 1-2 DAYS, DEPENDING ON CLINICAL CONDITION.   Avel Peace Winola Drum M.D on 07/26/2018   Between 7am to 6pm - Pager - [phone]  After 6pm go to [email or web address] - password [email or web address] [facility]  Office  [phone]  CC: Primary care physician; Donnie Coffin, MD  Note: This dictation was prepared with Dragon dictation along with smaller phrase technology. Any transcriptional errors that result from this process are unintentional.

## 2018-07-26 NOTE — Progress Notes (Signed)
Family Meeting Note  Advance Directive:yes  Today a meeting took place with the Patient.  Patient is able to participate    The following clinical team members were present during this meeting:MD  The following were discussed:Patient's diagnosis: Alcoholism, hyperkalemia, anemia, Patient's progosis: Unable to determine and Goals for treatment: Full Code  Additional follow-up to be provided: prn  Time spent during discussion:20 minutes  Gorden Harms, MD

## 2018-07-26 NOTE — Progress Notes (Signed)
PT Cancellation Note  Patient Details Name: Morgan Mcpherson MRN: [id number] DOB: 10/30/1945   Cancelled Treatment:    Reason Eval/Treat Not Completed: Medical issues which prohibited therapy.  Pt is demonstrating some critical labs so will ck again later to see how things are progressing.   Ramond Dial 07/26/2018, 9:01 AM   Mee Hives, PT MS Acute Rehab Dept. Number: [place] [facility] [place]

## 2018-07-26 NOTE — Progress Notes (Signed)
CRITICAL VALUE ALERT  Critical Value:  K+ 2.3  Date & Time Notied:  07/26/2018   Provider Notified: Cyndra Numbers, MD  Orders Received/Actions taken: Continue to monitor. Labs to be redrawn at 1000.

## 2018-07-26 NOTE — Consult Note (Signed)
Pharmacy Electrolyte Monitoring Consult:  Pharmacy consulted to assist in monitoring and replacing electrolytes in this 72 y.o. female admitted on 07/25/2018 with weakness and h/o EtOH abuse. Patient states that she stopped drinking 3 weeks ago and denies w/d's. Patient is severely hypokalemic/phosphatemic/magnesemic hypocalcemic. Patient was ordered thiamine/FA/multivitamins as a precautionary to prevent Wernicke's and has been placed on precautionary CIWA.  Labs:  Sodium (mmol/L)  Date Value  07/26/2018 136  01/14/2014 139   Potassium (mmol/L)  Date Value  07/26/2018 3.1 (L)  01/14/2014 3.3 (L)   Magnesium (mg/dL)  Date Value  07/26/2018 2.0  01/14/2014 1.8   Phosphorus (mg/dL)  Date Value  07/26/2018 2.3 (L)   Calcium (mg/dL)  Date Value  07/26/2018 6.5 (L)   Calcium, Total (mg/dL)  Date Value  01/14/2014 7.6 (L)   Albumin (g/dL)  Date Value  07/26/2018 2.4 (L)  01/10/2014 2.3 (L)   11/22 @ 2236 K 2.6, Phos 1.2, Mag 1.5, Cal 6.1 (CCa 7.0). Will replace electrolytes via: - Fluids changed to D5 + NS + 40 mEq/L @ 75 ml/hr (MD notified and agrees) - Kphos 45 mmol IV x 1 over 6 hours - Mag 4g IV x 1 - Calcium gluconate IV x 1    Assessment/Plan: 11/23 labs @ 1031: K 2.6, Mag 2.0 Phos 2.3 Scr 0.47 Ca 6.3 Albumin 2.4 Albumin Corrected Calcium 7.6  11/23 Labs K - 3.1, Ca - 6.5 (calculated corrected 7.78) Ordered stat phos and Mg draw.  Will hold potassium replacement until phos level has resulted.  Will order Calcium Gluconate 2 gm IV once.  Will monitor all electrolytes w/ am labs and will continue to monitor to replace as necessary.  Evelena Asa, PharmD Clinical Pharmacist 07/26/2018

## 2018-07-26 NOTE — Consult Note (Signed)
Pharmacy Electrolyte Monitoring Consult:  Pharmacy consulted to assist in monitoring and replacing electrolytes in this 72 y.o. female admitted on 07/25/2018 with weakness and h/o EtOH abuse. Patient states that she stopped drinking 3 weeks ago and denies w/d's. Patient is severely hypokalemic/phosphatemic/magnesemic hypocalcemic. Patient was ordered thiamine/FA/multivitamins as a precautionary to prevent Wernicke's and has been placed on precautionary CIWA.  Labs:  Sodium (mmol/L)  Date Value  07/25/2018 135  01/14/2014 139   Potassium (mmol/L)  Date Value  07/25/2018 2.6 (LL)  01/14/2014 3.3 (L)   Magnesium (mg/dL)  Date Value  07/25/2018 1.5 (L)  01/14/2014 1.8   Phosphorus (mg/dL)  Date Value  07/25/2018 1.2 (L)   Calcium (mg/dL)  Date Value  07/25/2018 6.1 (LL)   Calcium, Total (mg/dL)  Date Value  01/14/2014 7.6 (L)   Albumin (g/dL)  Date Value  07/25/2018 2.9 (L)  01/10/2014 2.3 (L)    Assessment/Plan: 11/22 @ 2236 K 2.6, Phos 1.2, Mag 1.5, Cal 6.1 (CCa 7.0). Will replace electrolytes via: - Fluids changed to D5 + NS + 40 mEq/L @ 75 ml/hr (MD notified and agrees) - Kphos 45 mmol IV x 1 over 6 hours - Mag 4g IV x 1 - Calcium gluconate IV x 1  EKG still pending, patient's BG in the low 100's. Patient has been given thiamine 100 mg IV x 1 before fluid administration as to prevent Wernicke's. Will monitor all electrolytes/BG w/ am labs and will continue to monitor to replace as necessary.  Tobie Lords, PharmD, BCPS Clinical Pharmacist 07/26/2018

## 2018-07-27 ENCOUNTER — Inpatient Hospital Stay: Payer: Medicare Other | Admitting: Anesthesiology

## 2018-07-27 ENCOUNTER — Encounter
Admission: EM | Disposition: A | Discharge status: Home Health | Payer: Self-pay | Source: Home / Self Care | Attending: Family Medicine

## 2018-07-27 ENCOUNTER — Encounter: Payer: Self-pay | Admitting: Anesthesiology

## 2018-07-27 LAB — BASIC METABOLIC PANEL
Anion gap: 12 (ref 5–15)
CALCIUM: 7 mg/dL — AB (ref 8.9–10.3)
CO2: 29 mmol/L (ref 22–32)
Chloride: 96 mmol/L — ABNORMAL LOW (ref 98–111)
Creatinine, Ser: 0.41 mg/dL — ABNORMAL LOW (ref 0.44–1.00)
GFR calc Af Amer: >60 mL/min (ref >60)
Glucose, Bld: 110 mg/dL — ABNORMAL HIGH (ref 70–99)
Potassium: 3.1 mmol/L — ABNORMAL LOW (ref 3.5–5.1)
SODIUM: 137 mmol/L (ref 135–145)

## 2018-07-27 LAB — BPAM RBC
Blood Product Expiration Date: 201912102359
Blood Product Expiration Date: 201912102359
ISSUE DATE / TIME: 201911221637
ISSUE DATE / TIME: 201911231446
Unit Type and Rh: 7300
Unit Type and Rh: 7300

## 2018-07-27 LAB — TYPE AND SCREEN
ABO/RH(D): B POS
ANTIBODY SCREEN: NEGATIVE
UNIT DIVISION: 0
UNIT DIVISION: 0

## 2018-07-27 LAB — PHOSPHORUS: Phosphorus: 2.9 mg/dL (ref 2.5–4.6)

## 2018-07-27 SURGERY — ESOPHAGOGASTRODUODENOSCOPY (EGD) WITH PROPOFOL
Anesthesia: General

## 2018-07-27 MED ORDER — PEG 3350-KCL-NA BICARB-NACL 420 G PO SOLR
4000.0000 mL | Freq: Once | ORAL | Status: AC
  Administered 2018-07-27: 4000 mL via ORAL
  Filled 2018-07-27: qty 4000

## 2018-07-27 MED ORDER — POTASSIUM PHOSPHATES 15 MMOLE/5ML IV SOLN
30.0000 mmol | Freq: Once | INTRAVENOUS | Status: AC
  Administered 2018-07-27: 02:00:00 30 mmol via INTRAVENOUS
  Filled 2018-07-27: qty 10

## 2018-07-27 MED ORDER — POTASSIUM CHLORIDE CRYS ER 10 MEQ PO TBCR
20.0000 meq | EXTENDED_RELEASE_TABLET | ORAL | Status: AC
  Administered 2018-07-27 (×2): 20 meq via ORAL
  Filled 2018-07-27 (×2): qty 2

## 2018-07-27 MED ORDER — SODIUM CHLORIDE 0.9 % IV SOLN
INTRAVENOUS | Status: DC
  Administered 2018-07-28: 1000 mL via INTRAVENOUS

## 2018-07-27 MED ORDER — PEG 3350-KCL-NA BICARB-NACL 420 G PO SOLR
4000.0000 mL | Freq: Once | ORAL | Status: DC
  Filled 2018-07-27: qty 4000

## 2018-07-27 NOTE — Progress Notes (Signed)
Informed this morning she was still having brown stool  Procedures cancelled for today and will schedule for tomorrow    Dr Jonathon Bellows MD,MRCP [facility]) Gastroenterology/Hepatology Pager: [phone]

## 2018-07-27 NOTE — Consult Note (Signed)
Pharmacy Electrolyte Monitoring Consult:  Pharmacy consulted to assist in monitoring and replacing electrolytes in this 72 y.o. female admitted on 07/25/2018 with weakness and h/o EtOH abuse. Patient states that she stopped drinking 3 weeks ago and denies w/d's. Patient is severely hypokalemic/phosphatemic/magnesemic hypocalcemic. Patient was ordered thiamine/FA/multivitamins as a precautionary to prevent Wernicke's and has been placed on precautionary CIWA.  Labs:  Sodium (mmol/L)  Date Value  07/27/2018 137  01/14/2014 139   Potassium (mmol/L)  Date Value  07/27/2018 3.1 (L)  01/14/2014 3.3 (L)   Magnesium (mg/dL)  Date Value  07/26/2018 1.7  01/14/2014 1.8   Phosphorus (mg/dL)  Date Value  07/27/2018 2.9   Calcium (mg/dL)  Date Value  07/27/2018 7.0 (L)   Calcium, Total (mg/dL)  Date Value  01/14/2014 7.6 (L)   Albumin (g/dL)  Date Value  07/26/2018 2.4 (L)  01/10/2014 2.3 (L)   11/22 @ 2236 K 2.6, Phos 1.2, Mag 1.5, Cal 6.1 (CCa 7.0). Will replace electrolytes via: - Fluids changed to D5 + NS + 40 mEq/L @ 75 ml/hr (MD notified and agrees) - Kphos 45 mmol IV x 1 over 6 hours - Mag 4g IV x 1 - Calcium gluconate IV x 1  Assessment/Plan: 11/24 K 3.1, Ca 7.0, (CCa 8.3) Phos 2.9, Scr 0.41 -currently ordered D5NS w/ KCL 68meq/L @ 75 ml/hr.  Will order KCL PO 20 meq PO q2h x 2 doses  Chinita Greenland PharmD Clinical Pharmacist 07/27/2018

## 2018-07-27 NOTE — Progress Notes (Signed)
[facility] NAME: Morgan Mcpherson    MR#:  [id number]  DATE OF BIRTH:  Feb 11, 1946  SUBJECTIVE:  CHIEF COMPLAINT:   Chief Complaint  Patient presents with  . Weakness   Patient without complaint, for endoscopy on tomorrow due to inadequate prep, case discussed with Dr. Anna/gastroenterology  REVIEW OF SYSTEMS:  CONSTITUTIONAL: No fever, fatigue or weakness.  EYES: No blurred or double vision.  EARS, NOSE, AND THROAT: No tinnitus or ear pain.  RESPIRATORY: No cough, shortness of breath, wheezing or hemoptysis.  CARDIOVASCULAR: No chest pain, orthopnea, edema.  GASTROINTESTINAL: No nausea, vomiting, diarrhea or abdominal pain.  GENITOURINARY: No dysuria, hematuria.  ENDOCRINE: No polyuria, nocturia,  HEMATOLOGY: No anemia, easy bruising or bleeding SKIN: No rash or lesion. MUSCULOSKELETAL: No joint pain or arthritis.   NEUROLOGIC: No tingling, numbness, weakness.  PSYCHIATRY: No anxiety or depression.   ROS  DRUG ALLERGIES:  No Known Allergies  VITALS:  Blood pressure 101/66, pulse 93, temperature 98.2 F (36.8 C), temperature source Oral, resp. rate 20, height 5\' 2"  (1.575 m), weight 90.7 kg, SpO2 96 %.  PHYSICAL EXAMINATION:  GENERAL:  72 y.o.-year-old patient lying in the bed with no acute distress.  EYES: Pupils equal, round, reactive to light and accommodation. No scleral icterus. Extraocular muscles intact.  HEENT: Head atraumatic, normocephalic. Oropharynx and nasopharynx clear.  NECK:  Supple, no jugular venous distention. No thyroid enlargement, no tenderness.  LUNGS: Normal breath sounds bilaterally, no wheezing, rales,rhonchi or crepitation. No use of accessory muscles of respiration.  CARDIOVASCULAR: S1, S2 normal. No murmurs, rubs, or gallops.  ABDOMEN: Soft, nontender, nondistended. Bowel sounds present. No organomegaly or mass.  EXTREMITIES: No pedal edema, cyanosis, or clubbing.  NEUROLOGIC: Cranial nerves II  through XII are intact. Muscle strength 5/5 in all extremities. Sensation intact. Gait not checked.  PSYCHIATRIC: The patient is alert and oriented x 3.  SKIN: No obvious rash, lesion, or ulcer.   Physical Exam LABORATORY PANEL:   CBC Recent Labs  Lab 07/26/18 0333 07/26/18 1859  WBC 5.0  --   HGB 7.2* 8.5*  HCT 21.6* 25.6*  PLT 184  --    ------------------------------------------------------------------------------------------------------------------  Chemistries  Recent Labs  Lab 07/25/18 0954  07/26/18 1031 07/26/18 1859 07/26/18 2200  NA 134*   < > 136  --   --   K <2.0*   < > 2.6* 3.1*  --   CL 81*   < > 90*  --   --   CO2 35*   < > 34*  --   --   GLUCOSE 105*   < > 111*  --   --   BUN 6*   < > <5*  --   --   CREATININE 0.53   < > 0.47  --   --   CALCIUM 6.3*   < > 6.3* 6.5*  --   MG  --    < > 2.0  --  1.7  AST 23  --   --   --   --   ALT 7  --   --   --   --   ALKPHOS 59  --   --   --   --   BILITOT 1.6*  --   --   --   --    < > = values in this interval not displayed.   ------------------------------------------------------------------------------------------------------------------  Cardiac Enzymes No results for input(s): TROPONINI in the  last 168 hours. ------------------------------------------------------------------------------------------------------------------  RADIOLOGY:  Dg Chest Portable 1 View  Result Date: 07/25/2018 CLINICAL DATA:  Weakness.  Evaluate for consolidation or edema EXAM: PORTABLE CHEST 1 VIEW COMPARISON:  01/10/2014 FINDINGS: Hazy density at the right base is likely from a diaphragmatic lobulation and was also seen previously. Chronic cardiomegaly accentuated by fat based on 2018 abdominal CT. There is no edema, consolidation, effusion, or pneumothorax. IMPRESSION: 1. No acute finding or change from 2015. 2. Cardiomegaly. Electronically Signed   By: Monte Fantasia M.D.   On: 07/25/2018 12:57   US Liver Doppler  Result Date:  07/26/2018 CLINICAL DATA:  Alcoholic liver disease. EXAM: DUPLEX ULTRASOUND OF LIVER TECHNIQUE: Color and duplex Doppler ultrasound was performed to evaluate the hepatic in-flow and out-flow vessels. COMPARISON:  Prior abdominal ultrasound on 02/27/2017 and CT of the abdomen on 04/16/2017. FINDINGS: Portal Vein Velocities Main:  24-37 cm/sec Right:  14 cm/sec Left:  12 cm/sec Low portal vein velocities but normal direction of flow in a hepatopetal direction. Hepatic Vein Velocities Right:  17 cm/sec Middle:  53 cm/sec Left:  28 cm/sec Hepatic vein waveforms are normal without abnormal pulsatility. Hepatic Artery Velocity:  51 cm/sec Splenic Vein Velocity:  17 cm/sec Varices: None visualized by ultrasound. Ascites: No ascites identified. The liver demonstrates coarse echotexture and extremely increased echogenicity, likely reflecting at least diffuse steatosis. Surface contour is not overtly nodular. No focal lesions are identified. There is no evidence of intrahepatic biliary ductal dilatation. The portal vein demonstrates no thrombus. Normal spleen size with estimated splenic volume of approximately 207 mL. IMPRESSION: 1. Low portal vein velocities are suggestive of some degree of portal hypertension. However, no ascites or splenomegaly identified. There is no evidence of portal vein thrombus. 2. The liver at self is extremely echogenic, likely reflecting at least diffuse steatosis. Component of fibrosis may be present. Electronically Signed   By: Aletta Edouard M.D.   On: 07/26/2018 11:15    ASSESSMENT AND PLAN:  72 year old female with a history of EtOH abuse who presents to the ER due to generalized weakness and found to have significant abnormalities in electrolytes along with anemia.  *Acute severe hypokalemia, hypochloremia  Pharmacy consulted for electrolyte replacement    *Acute anemia  Anemia work-up consistent with folate deficiency For endoscopy on tomorrow per gastroenterology/Dr. Vicente Males,  clear liquid diet for now, n.p.o. after midnight  *Chronic EtOH dependence Continue alcohol withdrawal protocol  *Chronic protein calorie malnutrition Dietary consulted  *Lower extremity scaly skin Wound care consulted  *Generalized weakness  due to anemia and electrolyte abnormalities Physical therapy consulted   All the records are reviewed and case discussed with Care Management/Social Workerr. Management plans discussed with the patient, family and they are in agreement.  CODE STATUS: full  TOTAL TIME TAKING CARE OF THIS PATIENT: 40 minutes.     POSSIBLE D/C IN 1-2 DAYS, DEPENDING ON CLINICAL CONDITION.   Avel Peace  M.D on 07/27/2018   Between 7am to 6pm - Pager - [phone]  After 6pm go to [email or web address] - password [email or web address] [facility]  Office  [phone]  CC: Primary care physician; Donnie Coffin, MD  Note: This dictation was prepared with Dragon dictation along with smaller phrase technology. Any transcriptional errors that result from this process are unintentional.

## 2018-07-27 NOTE — Consult Note (Addendum)
Pharmacy Electrolyte Monitoring Consult:  Pharmacy consulted to assist in monitoring and replacing electrolytes in this 72 y.o. female admitted on 07/25/2018 with weakness and h/o EtOH abuse. Patient states that she stopped drinking 3 weeks ago and denies w/d's. Patient is severely hypokalemic/phosphatemic/magnesemic hypocalcemic. Patient was ordered thiamine/FA/multivitamins as a precautionary to prevent Wernicke's and has been placed on precautionary CIWA.  Labs:  Sodium (mmol/L)  Date Value  07/26/2018 136  01/14/2014 139   Potassium (mmol/L)  Date Value  07/26/2018 3.1 (L)  01/14/2014 3.3 (L)   Magnesium (mg/dL)  Date Value  07/26/2018 1.7  01/14/2014 1.8   Phosphorus (mg/dL)  Date Value  07/26/2018 1.8 (L)   Calcium (mg/dL)  Date Value  07/26/2018 6.5 (L)   Calcium, Total (mg/dL)  Date Value  01/14/2014 7.6 (L)   Albumin (g/dL)  Date Value  07/26/2018 2.4 (L)  01/10/2014 2.3 (L)   11/22 @ 2236 K 2.6, Phos 1.2, Mag 1.5, Cal 6.1 (CCa 7.0). Will replace electrolytes via: - Fluids changed to D5 + NS + 40 mEq/L @ 75 ml/hr (MD notified and agrees) - Kphos 45 mmol IV x 1 over 6 hours - Mag 4g IV x 1 - Calcium gluconate IV x 1  Assessment/Plan: 11/23 @ 2200 K 3.1, Ca 6.5, Phos 1.8, Mag 1.7. - Will replace w/ Kphos 30 mmol IV x 1 over 6 hours - Patient is going to received another calcium gluconate 1g IV x 2 - Mag is WNL Will continue to monitor electrolytes @ 1400.  Tobie Lords, PharmD, BCPS Clinical Pharmacist 07/27/2018

## 2018-07-27 NOTE — Evaluation (Addendum)
Physical Therapy Evaluation Patient Details Name: Morgan Mcpherson MRN: [id number] DOB: [birth year] Today's Date: 07/27/2018   History of Present Illness  72 yo female with onset of hypokalemia and low Ca was admitted for falling over the last 2 weeks.  Has hepatic steatosis, cardiomegaly, anemia.  PMHx:  EtOH dependence, malnutrition, skin changes LE's,   Clinical Impression  Pt is up to walk with assistance and note unsafe presentation with buckling legs and knees. Her plan is to ask for SNF but is likely to refuse as she is clear she is going home.  Does not have consistent help per her first efforts to explain home but after talking with PT about recommendations states she has help at all times.  Will still ask for this as she is a high fall risk with her weakness and inconsistent use of walker in her history.  Follow acutely for strengthening and balance skills.  Vitals with therapy were supine:  98% sat and pulse 103 on room air, sitting:  Sat 99% with pulse 93 and BP 99/57 room air, standing:  Sat 100%, pulse 108 and BP 107/67 on room air.    Follow Up Recommendations SNF    Equipment Recommendations  None recommended by PT    Recommendations for Other Services       Precautions / Restrictions Precautions Precautions: Fall(telemetry) Restrictions Weight Bearing Restrictions: No      Mobility  Bed Mobility Overal bed mobility: Needs Assistance Bed Mobility: Supine to Sit     Supine to sit: Mod assist     General bed mobility comments: help to support trunk and to scoot out to edge of bed  Transfers Overall transfer level: Needs assistance Equipment used: Rolling walker (2 wheeled);1 person hand held assist Transfers: Sit to/from Stand Sit to Stand: Mod assist         General transfer comment: mod to power up and get control of standing  Ambulation/Gait Ambulation/Gait assistance: Min guard;Min assist Gait Distance (Feet): 5 Feet Assistive device: Rolling walker (2  wheeled);1 person hand held assist Gait Pattern/deviations: Step-through pattern;Decreased stride length;Wide base of support;Trunk flexed Gait velocity: reduced Gait velocity interpretation: <1.8 ft/sec, indicate of risk for recurrent falls General Gait Details: pt is demonstrating some buckling on knees with standing but vitals were stable for pulses, sats and BP in all postures  Stairs            Wheelchair Mobility    Modified Rankin (Stroke Patients Only)       Balance Overall balance assessment: Needs assistance;History of Falls Sitting-balance support: Feet supported Sitting balance-Leahy Scale: Fair     Standing balance support: Bilateral upper extremity supported;During functional activity Standing balance-Leahy Scale: Poor                               Pertinent Vitals/Pain Pain Assessment: No/denies pain    Home Living Family/patient expects to be discharged to:: Private residence Living Arrangements: Alone Available Help at Discharge: Family;Available PRN/intermittently Type of Home: House Home Access: Stairs to enter Entrance Stairs-Rails: Left Entrance Stairs-Number of Steps: 3 Home Layout: One level Home Equipment: Walker - 2 wheels Additional Comments: pt is falling but may not be using walker when she falls.    Prior Function Level of Independence: Needs assistance   Gait / Transfers Assistance Needed: RW with inside and outside gait  ADL's / Homemaking Assistance Needed: has family dropping in to help with housework and  soem cooking(does her own bathing and dressing)        Hand Dominance   Dominant Hand: Right    Extremity/Trunk Assessment   Upper Extremity Assessment Upper Extremity Assessment: Generalized weakness    Lower Extremity Assessment Lower Extremity Assessment: Generalized weakness    Cervical / Trunk Assessment Cervical / Trunk Assessment: Kyphotic  Communication   Communication: No difficulties   Cognition Arousal/Alertness: Awake/alert Behavior During Therapy: Agitated Overall Cognitive Status: No family/caregiver present to determine baseline cognitive functioning                                 General Comments: pt is inconsistent in describing her help at home, adamantly does not want SNF       General Comments General comments (skin integrity, edema, etc.): pt is demonstrating some decreased balance and difficulty controlling descent to the chair upon transition to recliner    Exercises  Pt has 4 to 4+ LE strength with DF 0 deg and hips in flexion contractures from disuse.   Assessment/Plan    PT Assessment Patient needs continued PT services  PT Problem List Decreased strength;Decreased range of motion;Decreased activity tolerance;Decreased balance;Decreased mobility;Decreased coordination;Decreased knowledge of use of DME;Decreased safety awareness;Obesity       PT Treatment Interventions DME instruction;Gait training;Stair training;Functional mobility training;Therapeutic activities;Therapeutic exercise;Balance training;Neuromuscular re-education;Patient/family education    PT Goals (Current goals can be found in the Care Plan section)  Acute Rehab PT Goals Patient Stated Goal: to go directly home PT Goal Formulation: With patient Time For Goal Achievement: 08/10/18 Potential to Achieve Goals: Good    Frequency Min 2X/week   Barriers to discharge Inaccessible home environment;Decreased caregiver support stairs to enter house and no assistance    Co-evaluation               AM-PAC PT "6 Clicks" Mobility  Outcome Measure Help needed turning from your back to your side while in a flat bed without using bedrails?: A Little Help needed moving from lying on your back to sitting on the side of a flat bed without using bedrails?: A Lot Help needed moving to and from a bed to a chair (including a wheelchair)?: A Lot Help needed standing up from  a chair using your arms (e.g., wheelchair or bedside chair)?: A Lot Help needed to walk in hospital room?: A Little Help needed climbing 3-5 steps with a railing? : Total 6 Click Score: 13    End of Session Equipment Utilized During Treatment: Gait belt(nursing removed O2) Activity Tolerance: Patient limited by fatigue(balance changes) Patient left: in chair;with call bell/phone within reach;with chair alarm set Nurse Communication: Mobility status;Other (comment)(gave nursing the orthstatic readings) PT Visit Diagnosis: Unsteadiness on feet (R26.81);Muscle weakness (generalized) (M62.81)    Time: 8938-1017 PT Time Calculation (min) (ACUTE ONLY): 28 min   Charges:   PT Evaluation $PT Eval Moderate Complexity: 1 Mod PT Treatments $Gait Training: 8-22 mins        Ramond Dial 07/27/2018, 12:01 PM   Mee Hives, PT MS Acute Rehab Dept. Number: [place] [facility] [place]

## 2018-07-27 NOTE — Plan of Care (Signed)
  Problem: Education: Goal: Knowledge of General Education information will improve Description: Including pain rating scale, medication(s)/side effects and non-pharmacologic comfort measures Outcome: Progressing   Problem: Health Behavior/Discharge Planning: Goal: Ability to manage health-related needs will improve Outcome: Progressing   Problem: Clinical Measurements: Goal: Ability to maintain clinical measurements within normal limits will improve Outcome: Progressing Goal: Will remain free from infection Outcome: Progressing Goal: Diagnostic test results will improve Outcome: Progressing Goal: Respiratory complications will improve Outcome: Progressing Goal: Cardiovascular complication will be avoided Outcome: Progressing   Problem: Activity: Goal: Risk for activity intolerance will decrease Outcome: Progressing   Problem: Nutrition: Goal: Adequate nutrition will be maintained Outcome: Progressing   Problem: Pain Managment: Goal: General experience of comfort will improve Outcome: Progressing   Problem: Safety: Goal: Ability to remain free from injury will improve Outcome: Progressing   

## 2018-07-27 NOTE — Anesthesia Preprocedure Evaluation (Deleted)
Anesthesia Evaluation  Patient identified by MRN, date of birth, ID band Patient awake    Reviewed: Allergy & Precautions, NPO status , Patient's Chart, lab work & pertinent test results  Airway Mallampati: III       Dental   Pulmonary neg pulmonary ROS,    Pulmonary exam normal        Cardiovascular hypertension, Pt. on medications Normal cardiovascular exam     Neuro/Psych negative neurological ROS  negative psych ROS   GI/Hepatic Neg liver ROS,   Endo/Other  negative endocrine ROS  Renal/GU negative Renal ROS     Musculoskeletal negative musculoskeletal ROS (+)   Abdominal Normal abdominal exam  (+)   Peds  Hematology  (+) Blood dyscrasia, anemia ,   Anesthesia Other Findings   Reproductive/Obstetrics                             Anesthesia Physical Anesthesia Plan  ASA: III  Anesthesia Plan: General   Post-op Pain Management:    Induction: Intravenous  PONV Risk Score and Plan: Propofol infusion  Airway Management Planned: Nasal Cannula  Additional Equipment:   Intra-op Plan:   Post-operative Plan:   Informed Consent:   Dental advisory given  Plan Discussed with: CRNA and Surgeon  Anesthesia Plan Comments:         Anesthesia Quick Evaluation

## 2018-07-28 ENCOUNTER — Ambulatory Visit: Admit: 2018-07-28 | Payer: Medicare Other | Admitting: Gastroenterology

## 2018-07-28 ENCOUNTER — Encounter
Admission: EM | Disposition: A | Discharge status: Home Health | Payer: Self-pay | Source: Home / Self Care | Attending: Family Medicine

## 2018-07-28 ENCOUNTER — Inpatient Hospital Stay: Payer: Medicare Other | Admitting: Anesthesiology

## 2018-07-28 DIAGNOSIS — K5730 Diverticulosis of large intestine without perforation or abscess without bleeding: Secondary | ICD-10-CM

## 2018-07-28 DIAGNOSIS — K3189 Other diseases of stomach and duodenum: Secondary | ICD-10-CM

## 2018-07-28 DIAGNOSIS — D124 Benign neoplasm of descending colon: Secondary | ICD-10-CM

## 2018-07-28 DIAGNOSIS — D120 Benign neoplasm of cecum: Secondary | ICD-10-CM

## 2018-07-28 HISTORY — PX: COLONOSCOPY WITH PROPOFOL: SHX5780

## 2018-07-28 HISTORY — PX: ESOPHAGOGASTRODUODENOSCOPY (EGD) WITH PROPOFOL: SHX5813

## 2018-07-28 LAB — BASIC METABOLIC PANEL
Anion gap: 6 (ref 5–15)
CHLORIDE: 104 mmol/L (ref 98–111)
CO2: 29 mmol/L (ref 22–32)
Calcium: 6.6 mg/dL — ABNORMAL LOW (ref 8.9–10.3)
Creatinine, Ser: 0.34 mg/dL — ABNORMAL LOW (ref 0.44–1.00)
GFR calc Af Amer: >60 mL/min (ref >60)
GFR calc non Af Amer: >60 mL/min (ref >60)
Glucose, Bld: 105 mg/dL — ABNORMAL HIGH (ref 70–99)
Potassium: 3.4 mmol/L — ABNORMAL LOW (ref 3.5–5.1)
SODIUM: 139 mmol/L (ref 135–145)

## 2018-07-28 LAB — CBC
HEMATOCRIT: 24.1 % — AB (ref 36.0–46.0)
HEMOGLOBIN: 8.1 g/dL — AB (ref 12.0–15.0)
MCH: 29.6 pg (ref 26.0–34.0)
MCHC: 33.6 g/dL (ref 30.0–36.0)
MCV: 88 fL (ref 80.0–100.0)
Platelets: 203 10*3/uL (ref 150–400)
RBC: 2.74 MIL/uL — AB (ref 3.87–5.11)
RDW: 16.1 % — ABNORMAL HIGH (ref 11.5–15.5)
WBC: 4.9 10*3/uL (ref 4.0–10.5)
nRBC: 0 % (ref 0.0–0.2)

## 2018-07-28 LAB — PHOSPHORUS: PHOSPHORUS: 1.7 mg/dL — AB (ref 2.5–4.6)

## 2018-07-28 LAB — MAGNESIUM: MAGNESIUM: 1.3 mg/dL — AB (ref 1.7–2.4)

## 2018-07-28 LAB — CALCIUM, IONIZED: CALCIUM, IONIZED, SERUM: 3.8 mg/dL — AB (ref 4.5–5.6)

## 2018-07-28 SURGERY — ESOPHAGOGASTRODUODENOSCOPY (EGD) WITH PROPOFOL
Anesthesia: General

## 2018-07-28 MED ORDER — SODIUM CHLORIDE 0.9 % IV SOLN: INTRAVENOUS | Status: DC

## 2018-07-28 MED ORDER — CALCIUM ACETATE (PHOS BINDER) 667 MG PO CAPS: 667.0000 mg | ORAL_CAPSULE | Freq: Three times a day (TID) | ORAL | 0 refills | Status: DC

## 2018-07-28 MED ORDER — POTASSIUM PHOSPHATES 15 MMOLE/5ML IV SOLN
20.0000 meq | Freq: Once | INTRAVENOUS | Status: AC
  Administered 2018-07-28: 20 meq via INTRAVENOUS
  Filled 2018-07-28: qty 4.55

## 2018-07-28 MED ORDER — MAGNESIUM SULFATE 2 GM/50ML IV SOLN
2.0000 g | Freq: Once | INTRAVENOUS | Status: AC
  Administered 2018-07-28: 2 g via INTRAVENOUS
  Filled 2018-07-28: qty 50

## 2018-07-28 MED ORDER — PHENYLEPHRINE HCL 10 MG/ML IJ SOLN
INTRAMUSCULAR | Status: DC | PRN
  Administered 2018-07-28 (×2): 100 ug via INTRAVENOUS
  Administered 2018-07-28: 200 ug via INTRAVENOUS
  Administered 2018-07-28 (×2): 100 ug via INTRAVENOUS
  Administered 2018-07-28 (×2): 200 ug via INTRAVENOUS
  Administered 2018-07-28: 100 ug via INTRAVENOUS

## 2018-07-28 MED ORDER — PROPOFOL 10 MG/ML IV BOLUS
INTRAVENOUS | Status: DC | PRN
  Administered 2018-07-28 (×2): 20 mg via INTRAVENOUS

## 2018-07-28 MED ORDER — PANTOPRAZOLE SODIUM 40 MG PO TBEC: 40.0000 mg | DELAYED_RELEASE_TABLET | Freq: Every day | ORAL | 1 refills | Status: DC

## 2018-07-28 MED ORDER — POTASSIUM CHLORIDE ER 10 MEQ PO TBCR: 10.0000 meq | EXTENDED_RELEASE_TABLET | Freq: Every day | ORAL | 0 refills | Status: DC

## 2018-07-28 MED ORDER — PANTOPRAZOLE SODIUM 40 MG PO TBEC: 40.0000 mg | DELAYED_RELEASE_TABLET | Freq: Every day | ORAL | 0 refills | Status: DC

## 2018-07-28 MED ORDER — PROPOFOL 500 MG/50ML IV EMUL
INTRAVENOUS | Status: DC | PRN
  Administered 2018-07-28: 100 ug/kg/min via INTRAVENOUS

## 2018-07-28 MED ORDER — FENTANYL CITRATE (PF) 100 MCG/2ML IJ SOLN: 25.0000 ug | INTRAMUSCULAR | Status: DC | PRN

## 2018-07-28 MED ORDER — LIDOCAINE HCL (CARDIAC) PF 100 MG/5ML IV SOSY
PREFILLED_SYRINGE | INTRAVENOUS | Status: DC | PRN
  Administered 2018-07-28: 50 mg via INTRAVENOUS

## 2018-07-28 MED ORDER — ONDANSETRON HCL 4 MG/2ML IJ SOLN: 4.0000 mg | Freq: Once | INTRAMUSCULAR | Status: DC | PRN

## 2018-07-28 NOTE — Discharge Summary (Signed)
[facility] NAME: Morgan Mcpherson    MR#:  [id number]  DATE OF BIRTH:  03/08/1946  DATE OF ADMISSION:  07/25/2018 ADMITTING PHYSICIAN: Bettey Costa, MD  DATE OF DISCHARGE: No discharge date for patient encounter.  PRIMARY CARE PHYSICIAN: Aycock, Ngwe A, MD    ADMISSION DIAGNOSIS:  Hypocalcemia [E83.51] Acute hypokalemia [E87.6] Generalized weakness [N82.9] Alcoholic cirrhosis, unspecified whether ascites present ([place]) [K70.30]  DISCHARGE DIAGNOSIS:  Active Problems:   Anemia   Hypokalemia   Benign neoplasm of descending colon   Diverticulosis of large intestine without diverticulitis   Benign neoplasm of cecum   Stomach irritation   SECONDARY DIAGNOSIS:   Past Medical History:  Diagnosis Date  . Cellulitis, leg   . Hypertension     HOSPITAL COURSE:   72 year old female with a history of EtOH abuse who presents to the ER due to generalized weakness and found to have significant abnormalities in electrolytes along with anemia.  *Acute severe hypokalemia, hypochloremia  Repleted Pharmacy consulted for electrolyte replacement   while in house  *Acute anemia  Stable Anemia work-up consistent with folate deficiency Status post EGD noted for congested erythematous antrum and pylorus-recommended PPI, colonoscopy noted for diverticulosis/polyp status post biopsy-recommended high-fiber diet with follow-up in 2 weeks for pathology results  *Chronic protein calorie malnutrition Stable Dietary consulted while in house  *Lower extremity scaly skin Wound care consulted  *Generalized weakness  due to anemia and electrolyte abnormalities Physical therapy did see patient while in house-recommended skilled nursing facility placement, but patient only wanted to go home-we will arrange for home health physical therapy status post discharge   DISCHARGE CONDITIONS:   stable  CONSULTS OBTAINED:    DRUG ALLERGIES:   No Known Allergies  DISCHARGE MEDICATIONS:   Allergies as of 07/28/2018   No Known Allergies     Medication List    TAKE these medications   calcium acetate 667 MG capsule Commonly known as:  PHOSLO Take 1 capsule (667 mg total) by mouth 3 (three) times daily with meals.   NEOMYCIN-POLYMYXIN-HYDROCORTISONE 1 % Soln OTIC solution Commonly known as:  CORTISPORIN Place 1 drop into the left ear 4 (four) times daily.   pantoprazole 40 MG tablet Commonly known as:  PROTONIX Take 1 tablet (40 mg total) by mouth daily.   potassium chloride 10 MEQ tablet Commonly known as:  K-DUR Take 1 tablet (10 mEq total) by mouth daily.        DISCHARGE INSTRUCTIONS:     If you experience worsening of your admission symptoms, develop shortness of breath, life threatening emergency, suicidal or homicidal thoughts you must seek medical attention immediately by calling 911 or calling your MD immediately  if symptoms less severe.  You Must read complete instructions/literature along with all the possible adverse reactions/side effects for all the Medicines you take and that have been prescribed to you. Take any new Medicines after you have completely understood and accept all the possible adverse reactions/side effects.   Please note  You were cared for by a hospitalist during your hospital stay. If you have any questions about your discharge medications or the care you received while you were in the hospital after you are discharged, you can call the unit and asked to speak with the hospitalist on call if the hospitalist that took care of you is not available. Once you are discharged, your primary care physician will handle any further medical issues. Please note that NO  REFILLS for any discharge medications will be authorized once you are discharged, as it is imperative that you return to your primary care physician (or establish a relationship with a primary care physician if you do not have one)  for your aftercare needs so that they can reassess your need for medications and monitor your lab values.    Today   CHIEF COMPLAINT:   Chief Complaint  Patient presents with  . Weakness    HISTORY OF PRESENT ILLNESS:    72 y.o. female with a known history of EtOH abuse who stopped drinking alcohol about 3 weeks ago presents to the ER due to generalized weakness. Patient that she has been a heavy drinker for several years however does not quantify how many years nor does she quantify how many drinks however about 3 weeks ago she decided that she want to quit smoking.  She denies going through EtOH withdrawal.  She was seen at a health care facility in the past and diagnosed with alcohol cirrhosis.  She does not take any other medications.  She presents today due to generalized weakness and decreased appetite.  She is unable to ambulate.  She has no focal deficits.  In the ER it is noted she has severe electrolyte abnormalities along with anemia.  She denies rectal bleeding, hematochezia, hematemesis, melena.  She denies abdominal pain.  VITAL SIGNS:  Blood pressure (!) 99/36, pulse 74, temperature 97.6 F (36.4 C), temperature source Tympanic, resp. rate 18, height 5\' 2"  (1.575 m), weight 90.7 kg, SpO2 100 %.  I/O:    Intake/Output Summary (Last 24 hours) at 07/28/2018 1211 Last data filed at 07/28/2018 1144 Gross per 24 hour  Intake 1217.07 ml  Output -  Net 1217.07 ml    PHYSICAL EXAMINATION:  GENERAL:  72 y.o.-year-old patient lying in the bed with no acute distress.  EYES: Pupils equal, round, reactive to light and accommodation. No scleral icterus. Extraocular muscles intact.  HEENT: Head atraumatic, normocephalic. Oropharynx and nasopharynx clear.  NECK:  Supple, no jugular venous distention. No thyroid enlargement, no tenderness.  LUNGS: Normal breath sounds bilaterally, no wheezing, rales,rhonchi or crepitation. No use of accessory muscles of respiration.   CARDIOVASCULAR: S1, S2 normal. No murmurs, rubs, or gallops.  ABDOMEN: Soft, non-tender, non-distended. Bowel sounds present. No organomegaly or mass.  EXTREMITIES: No pedal edema, cyanosis, or clubbing.  NEUROLOGIC: Cranial nerves II through XII are intact. Muscle strength 5/5 in all extremities. Sensation intact. Gait not checked.  PSYCHIATRIC: The patient is alert and oriented x 3.  SKIN: No obvious rash, lesion, or ulcer.   DATA REVIEW:   CBC Recent Labs  Lab 07/28/18 0350  WBC 4.9  HGB 8.1*  HCT 24.1*  PLT 203    Chemistries  Recent Labs  Lab 07/25/18 0954  07/28/18 0350  NA 134*   < > 139  K <2.0*   < > 3.4*  CL 81*   < > 104  CO2 35*   < > 29  GLUCOSE 105*   < > 105*  BUN 6*   < > <5*  CREATININE 0.53   < > 0.34*  CALCIUM 6.3*   < > 6.6*  MG  --    < > 1.3*  AST 23  --   --   ALT 7  --   --   ALKPHOS 59  --   --   BILITOT 1.6*  --   --    < > = values in  this interval not displayed.    Cardiac Enzymes No results for input(s): TROPONINI in the last 168 hours.  Microbiology Results  No results found for this or any previous visit.  RADIOLOGY:  No results found.  EKG:   Orders placed or performed during the hospital encounter of 07/25/18  . ED EKG  . ED EKG      Management plans discussed with the patient, family and they are in agreement.  CODE STATUS:     Code Status Orders  (From admission, onward)         Start     Ordered   07/25/18 1403  Full code  Continuous     07/25/18 1402        Code Status History    This patient has a current code status but no historical code status.      TOTAL TIME TAKING CARE OF THIS PATIENT: 40 minutes.    Avel Peace Salary M.D on 07/28/2018 at 12:11 PM  Between 7am to 6pm - Pager - [phone]  After 6pm go to [email or web address] - password [email or web address] [facility]  Office  [phone]  CC: Primary care physician; Donnie Coffin, MD   Note: This dictation was prepared with  Dragon dictation along with smaller phrase technology. Any transcriptional errors that result from this process are unintentional.

## 2018-07-28 NOTE — Clinical Social Work Note (Signed)
Clinical Social Work Assessment  Patient Details  Name: Morgan Mcpherson MRN: [id number] Date of Birth: 05-Sep-1945  Date of referral:  07/28/18               Reason for consult:  Facility Placement                Permission sought to share information with:  Case Manager, Customer service manager, Family Supports Permission granted to share information::  Yes, Verbal Permission Granted  Name::        Agency::     Relationship::     Contact Information:     Housing/Transportation Living arrangements for the past 2 months:  Single Family Home Source of Information:  Patient Patient Interpreter Needed:  None Criminal Activity/Legal Involvement Pertinent to Current Situation/Hospitalization:  No - Comment as needed Significant Relationships:  Siblings, Other Family Members Lives with:  Self Do you feel safe going back to the place where you live?  Yes Need for family participation in patient care:  No (Coment)  Care giving concerns:  Patient lives alone in [place] Worker assessment / plan:  CSW consulted for facility placement. CSW met with patient to discuss discharge plan. CSW introduced self and explained role. Patient states that she lives alone but has support from family and friends. CSW explained PT recommendation of SNF. Patient states that she does not want to go to a facility and would prefer to go home. Patient reports that she has used home health in the past but does not remember the company. Patient will return home when ready for discharge. CSW notified RNCM of need for home health. CSW signing off. Please re consult if further needs arise.   Employment status:  Retired Nurse, adult PT Recommendations:  [place] / Referral to community resources:  [place]  Patient/Family's Response to care:  Patient thanked CSW for assistance   Patient/Family's Understanding of and Emotional  Response to Diagnosis, Current Treatment, and Prognosis:  Patient understands current treatment and diagnoses.   Emotional Assessment Appearance:  Appears stated age Attitude/Demeanor/Rapport:    Affect (typically observed):  Guarded, Quiet Orientation:  Oriented to Self, Oriented to Place, Oriented to  Time, Oriented to Situation Alcohol / Substance use:  Not Applicable Psych involvement (Current and /or in the community):  No (Comment)  Discharge Needs  Concerns to be addressed:  Discharge Planning Concerns Readmission within the last 30 days:  No Current discharge risk:  Dependent with Mobility, Lives alone Barriers to Discharge:  Continued Medical Work up   [employer], [place] 07/28/2018, 9:48 AM

## 2018-07-28 NOTE — Consult Note (Signed)
Pharmacy Electrolyte Monitoring Consult:  Pharmacy consulted to assist in monitoring and replacing electrolytes in this 72 y.o. female admitted on 07/25/2018 with weakness and h/o EtOH abuse. Patient states that she stopped drinking 3 weeks ago and denies w/d's. Patient is severely hypokalemic/phosphatemic/magnesemic hypocalcemic. Patient was ordered thiamine/FA/multivitamins as a precautionary to prevent Wernicke's and has been placed on precautionary CIWA.  Labs:  Sodium (mmol/L)  Date Value  07/28/2018 139  01/14/2014 139   Potassium (mmol/L)  Date Value  07/28/2018 3.4 (L)  01/14/2014 3.3 (L)   Magnesium (mg/dL)  Date Value  07/28/2018 1.3 (L)  01/14/2014 1.8   Phosphorus (mg/dL)  Date Value  07/28/2018 1.7 (L)   Calcium (mg/dL)  Date Value  07/28/2018 6.6 (L)   Calcium, Total (mg/dL)  Date Value  01/14/2014 7.6 (L)   Albumin (g/dL)  Date Value  07/26/2018 2.4 (L)  01/10/2014 2.3 (L)   Assessment/Plan: Patient continues to require electrolyte supplementation.  K=3.4, receiving IVF w/  13mEq of KCl at 16ml/hr Mag=1.3, received Mag Sulfate 2g IV this morning Phos=1.3, received KPhos 33mEq IV this morning Ca=6.6, Corrected Ca=7.88, will order Calcium Gluconate 1g IV once patient returns from ENDO.  Will follow up on AM labs.  Paulina Fusi, PharmD, BCPS 07/28/2018 10:25 AM

## 2018-07-28 NOTE — Transfer of Care (Signed)
Immediate Anesthesia Transfer of Care Note  Patient: Morgan Mcpherson  Procedure(s) Performed: ESOPHAGOGASTRODUODENOSCOPY (EGD) WITH PROPOFOL (N/A ) COLONOSCOPY WITH PROPOFOL (N/A )  Patient Location: PACU  Anesthesia Type:General  Level of Consciousness: awake, alert  and oriented  Airway & Oxygen Therapy: Patient Spontanous Breathing and Patient connected to nasal cannula oxygen  Post-op Assessment: Report given to RN and Post -op Vital signs reviewed and stable  Post vital signs: Reviewed and stable  Last Vitals:  Vitals Value Taken Time  BP 99/36 07/28/2018 11:58 AM  Temp 36.4 C 07/28/2018 11:58 AM  Pulse 73 07/28/2018 12:00 PM  Resp 17 07/28/2018 12:00 PM  SpO2 100 % 07/28/2018 12:00 PM  Vitals shown include unvalidated device data.  Last Pain:  Vitals:   07/28/18 1158  TempSrc: Tympanic  PainSc:          Complications: No apparent anesthesia complications

## 2018-07-28 NOTE — Anesthesia Post-op Follow-up Note (Signed)
Anesthesia QCDR form completed.        

## 2018-07-28 NOTE — Progress Notes (Signed)
   Vonda Antigua, MD [address], [place], [place], [place], [address] [address], [place], [place], [place], [address] Phone: [phone]  Fax: [phone]   Subjective: Patient completed her prep.  No melena or hematochezia.  No nausea or vomiting.  No hematemesis.   Objective: Exam: Vital signs in last 24 hours: Vitals:   07/27/18 1913 07/28/18 0341 07/28/18 0921 07/28/18 0923  BP: (!) 111/54 112/64 (!) 109/59   Pulse: 85 90 88   Resp: 15 17 16    Temp: 97.8 F (36.6 C) 98.5 F (36.9 C) 98 F (36.7 C)   TempSrc:  Oral Oral   SpO2: 96% 95% 93% 95%  Weight:      Height:       Weight change:   Intake/Output Summary (Last 24 hours) at 07/28/2018 0954 Last data filed at 07/28/2018 0441 Gross per 24 hour  Intake 1397.07 ml  Output -  Net 1397.07 ml    General: No acute distress, AAO x3 Abd: Soft, NT/ND, No HSM Skin: Warm, no rashes Neck: Supple, Trachea midline   Lab Results: Lab Results  Component Value Date   WBC 4.9 07/28/2018   HGB 8.1 (L) 07/28/2018   HCT 24.1 (L) 07/28/2018   MCV 88.0 07/28/2018   PLT 203 07/28/2018   Micro Results: No results found for this or any previous visit (from the past 240 hour(s)). Studies/Results: No results found. Medications:  Scheduled Meds: . sodium chloride   Intravenous Once  . acetaminophen  650 mg Oral Once  . diphenhydrAMINE  25 mg Oral Once  . multivitamin with minerals  1 tablet Oral Daily  . spironolactone  50 mg Oral Daily  . thiamine  100 mg Oral Daily   Or  . thiamine  100 mg Intravenous Daily   Continuous Infusions: . sodium chloride    . sodium chloride    . dextrose 5 % and 0.9 % NaCl with KCl 40 mEq/L 75 mL/hr at 07/27/18 2153  . folic acid (FOLVITE) IVPB 1 mg (07/28/18 0940)  . potassium PHOSPHATE IVPB (mEq) 20 mEq (07/28/18 0906)   PRN Meds:.acetaminophen **OR** acetaminophen, LORazepam **OR** LORazepam, ondansetron **OR** ondansetron (ZOFRAN) IV, polyethylene  glycol   Assessment: 72 year old female with history of daily alcohol use, presents with weakness, and normocytic anemia, with no overt blood loss   Plan: Hemoglobin around 8 over the last 2 dasys Hemoglobin was low at 6.7 on presentation No overt GI blood loss No prior EGD or colonoscopy Normal MCV, elevated ferritin, normal serum iron, and no overt blood loss, suggest anemia is not from GI blood loss However, since patient has never had an endoscopy, EGD and colonoscopy would help rule out any underlying lesions could be contributing to her anemia.  We will plan on procedures today, and if negative, patient should follow-up with hematology in regard to her anemia.  I have discussed alternative options, risks & benefits,  which include, but are not limited to, bleeding, infection, perforation,respiratory complication & drug reaction.  The patient agrees with this plan & written consent will be obtained.      LOS: 3 days   Vonda Antigua, MD 07/28/2018, 9:54 AM

## 2018-07-28 NOTE — Care Management (Addendum)
Spoke with Morgan Mcpherson at the bedside. Still insisting that she wants to go home and not a skilled facility, Discussed Home Health agencies. [facility]. Telephone call to [facility], [facility] representative. Will check and see if [facility] can take this case. [facility] will take this case.  Discharge to home today per Dr. Tyler Deis RN MSN CCM Care Management [phone]

## 2018-07-28 NOTE — Op Note (Signed)
[facility] Patient Name: Morgan Mcpherson Procedure Date: 07/28/2018 10:26 AM MRN: [id number] Account #: [id number] Date of Birth: 1946/04/29 Admit Type: Inpatient Age: 72 Room: [facility] ENDO ROOM 4 Gender: Female Note Status: Finalized Procedure:            Colonoscopy Indications:          Anemia Providers:            Gerard Cantara B. Bonna Gains MD, MD Referring MD:         Edmonia Lynch. Aycock MD (Referring MD) Medicines:            Monitored Anesthesia Care Complications:        No immediate complications. Procedure:            Pre-Anesthesia Assessment:                       - ASA Grade Assessment: III - A patient with severe                        systemic disease.                       - Prior to the procedure, a History and Physical was                        performed, and patient medications, allergies and                        sensitivities were reviewed. The patient's tolerance of                        previous anesthesia was reviewed.                       - The risks and benefits of the procedure and the                        sedation options and risks were discussed with the                        patient. All questions were answered and informed                        consent was obtained.                       - Patient identification and proposed procedure were                        verified prior to the procedure by the physician, the                        nurse, the anesthesiologist, the anesthetist and the                        technician. The procedure was verified in the procedure                        room.                       After obtaining informed  consent, the colonoscope was                        passed under direct vision. Throughout the procedure,                        the patient's blood pressure, pulse, and oxygen                        saturations were monitored continuously. The                        Colonoscope was  introduced through the anus and                        advanced to the the terminal ileum. The colonoscopy was                        performed with ease. The patient tolerated the                        procedure well. The quality of the bowel preparation                        was fair. Findings:      The perianal and digital rectal examinations were normal.      Two sessile polyps were found in the cecum. The polyps were 3 to 4 mm in       size. These polyps were removed with a cold biopsy forceps. Resection       and retrieval were complete.      A 7 mm polyp was found in the descending colon. The polyp was sessile.       The polyp was removed with a cold snare. Resection and retrieval were       complete.      Multiple diverticula were found in the sigmoid colon.      The exam was otherwise without abnormality.      The rectum, sigmoid colon, descending colon, transverse colon, ascending       colon, cecum and ileum appeared normal.      There is no endoscopic evidence of bleeding in the entire colon.      The retroflexed view of the distal rectum and anal verge was normal and       showed no anal or rectal abnormalities. Impression:           - Preparation of the colon was fair.                       - Two 3 to 4 mm polyps in the cecum, removed with a                        cold biopsy forceps. Resected and retrieved.                       - One 7 mm polyp in the descending colon, removed with                        a cold snare. Resected and retrieved.                       -  Diverticulosis in the sigmoid colon.                       - The examination was otherwise normal.                       - The rectum, sigmoid colon, descending colon,                        transverse colon, ascending colon, cecum and terminal                        ileum are normal.                       - The distal rectum and anal verge are normal on                        retroflexion  view. Recommendation:       - Refer to a hematologist for normocytic anemia.                       - High fiber diet.                       - Advance diet as tolerated.                       - Continue present medications.                       - Await pathology results.                       - Repeat colonoscopy in 1 year with 2 day prep due to                        fair prep today.                       - The findings and recommendations were discussed with                        the patient.                       - Return to primary care physician as previously                        scheduled.                       - Continue Serial CBCs and transfuse PRN Procedure Code(s):    --- Professional ---                       [phone], Colonoscopy, flexible; with removal of tumor(s),                        polyp(s), or other lesion(s) by snare technique                       45380, 59, Colonoscopy, flexible; with biopsy, single  or multiple Diagnosis Code(s):    --- Professional ---                       D12.0, Benign neoplasm of cecum                       D12.4, Benign neoplasm of descending colon                       D64.9, Anemia, unspecified                       K57.30, Diverticulosis of large intestine without                        perforation or abscess without bleeding CPT copyright 2018 American Medical Association. All rights reserved. The codes documented in this report are preliminary and upon coder review may  be revised to meet current compliance requirements.  Vonda Antigua, MD Margretta Sidle B. Bonna Gains MD, MD 07/28/2018 12:01:00 PM This report has been signed electronically. Number of Addenda: 0 Note Initiated On: 07/28/2018 10:26 AM Scope Withdrawal Time: 0 hours 24 minutes 22 seconds  Total Procedure Duration: 0 hours 31 minutes 56 seconds  Estimated Blood Loss: Estimated blood loss: none.      [facility]

## 2018-07-28 NOTE — Op Note (Signed)
[facility] Gastroenterology Patient Name: Morgan Mcpherson Procedure Date: 07/28/2018 10:26 AM MRN: [id number] Account #: [id number] Date of Birth: 09-02-1946 Admit Type: Inpatient Age: 72 Room: [facility] ENDO ROOM 4 Gender: Female Note Status: Finalized Procedure:            Upper GI endoscopy Indications:          Anemia Providers:            Teale Goodgame B. Bonna Gains MD, MD Referring MD:         Edmonia Lynch. Aycock MD (Referring MD) Medicines:            Monitored Anesthesia Care Complications:        No immediate complications. Procedure:            Pre-Anesthesia Assessment:                       - Prior to the procedure, a History and Physical was                        performed, and patient medications, allergies and                        sensitivities were reviewed. The patient's tolerance of                        previous anesthesia was reviewed.                       - The risks and benefits of the procedure and the                        sedation options and risks were discussed with the                        patient. All questions were answered and informed                        consent was obtained.                       - Patient identification and proposed procedure were                        verified prior to the procedure by the physician, the                        nurse, the anesthesiologist, the anesthetist and the                        technician. The procedure was verified in the procedure                        room.                       - ASA Grade Assessment: III - A patient with severe                        systemic disease.                       After  obtaining informed consent, the endoscope was                        passed under direct vision. Throughout the procedure,                        the patient's blood pressure, pulse, and oxygen                        saturations were monitored continuously. The Endoscope   was introduced through the mouth, and advanced to the                        third part of duodenum. The upper GI endoscopy was                        accomplished with ease. The patient tolerated the                        procedure well. Findings:      Food was found in the mid esophagus and in the distal esophagus. This       was characterized by small soft, granular yellow material that easily       removed with water and suctioning. No large pieces, no food impaction       present.      The exam of the esophagus was otherwise normal.      Patchy mild mucosal changes characterized by congestion, erythema and       thickened folds were found in the gastric antrum and at the pylorus.       Biopsies were taken with a cold forceps for histology.      The duodenal bulb, second portion of the duodenum, third portion of the       duodenum and examined duodenum were normal.      No signs of active or recent bleeding throughout the exam. Impression:           - Food in the mid esophagus and in the distal esophagus.                       - Congested, erythematous and thickened folds mucosa in                        the antrum and pylorus. Biopsied.                       - Normal duodenal bulb, second portion of the duodenum,                        third portion of the duodenum and examined duodenum.                       - No signs of active or recent bleeding throughout the                        exam. Recommendation:       - Await pathology results.                       - Change PPI from IV to PO once daily                       -  Follow an antireflux regimen.                       - Advance diet as tolerated.                       - Continue present medications.                       - Patient has a contact number available for                        emergencies. The signs and symptoms of potential                        delayed complications were discussed with the patient.                         Return to normal activities tomorrow. Written discharge                        instructions were provided to the patient.                       - The findings and recommendations were discussed with                        the patient. Procedure Code(s):    --- Professional ---                       [phone], Esophagogastroduodenoscopy, flexible, transoral;                        with biopsy, single or multiple Diagnosis Code(s):    --- Professional ---                       T61.443X, Food in esophagus causing other injury,                        initial encounter                       K31.89, Other diseases of stomach and duodenum                       D64.9, Anemia, unspecified CPT copyright 2018 American [employer] Association. All rights reserved. The codes documented in this report are preliminary and upon coder review may  be revised to meet current compliance requirements.  Vonda Antigua, MD Margretta Sidle B. Bonna Gains MD, MD 07/28/2018 11:20:31 AM This report has been signed electronically. Number of Addenda: 0 Note Initiated On: 07/28/2018 10:26 AM Estimated Blood Loss: Estimated blood loss: none.      [facility]

## 2018-07-28 NOTE — Anesthesia Preprocedure Evaluation (Addendum)
Anesthesia Evaluation  Patient identified by MRN, date of birth, ID band Patient awake    Reviewed: Allergy & Precautions, H&P , NPO status , Patient's Chart, lab work & pertinent test results  Airway Mallampati: III       Dental  (+) Missing, Chipped, Poor Dentition   Pulmonary neg pulmonary ROS,           Cardiovascular hypertension, + dysrhythmias (incomplete RBBB)      Neuro/Psych negative neurological ROS  negative psych ROS   GI/Hepatic negative GI ROS, Neg liver ROS,   Endo/Other  negative endocrine ROS  Renal/GU negative Renal ROS  negative genitourinary   Musculoskeletal   Abdominal   Peds  Hematology negative hematology ROS (+) Blood dyscrasia, anemia ,   Anesthesia Other Findings Past Medical History: No date: Cellulitis, leg No date: Hypertension  History reviewed. No pertinent surgical history.  BMI    Body Mass Index:  36.58 kg/m   Reported hx of cardiomegaly (no echo available for review), malnutrition, electrolyte disturbances     Reproductive/Obstetrics negative OB ROS                            Anesthesia Physical Anesthesia Plan  ASA: III  Anesthesia Plan: General   Post-op Pain Management:    Induction:   PONV Risk Score and Plan: Propofol infusion and TIVA  Airway Management Planned: Natural Airway and Nasal Cannula  Additional Equipment:   Intra-op Plan:   Post-operative Plan:   Informed Consent: I have reviewed the patients History and Physical, chart, labs and discussed the procedure including the risks, benefits and alternatives for the proposed anesthesia with the patient or authorized representative who has indicated his/her understanding and acceptance.   Dental Advisory Given  Plan Discussed with: Anesthesiologist  Anesthesia Plan Comments: (Pt denies hematemesis. NPO appropriate.)      Anesthesia Quick Evaluation

## 2018-07-28 NOTE — Care Management Note (Signed)
Case Management Note  Patient Details  Name: Raejean Swinford MRN: [id number] Date of Birth: 1945/10/06  Subjective/Objective:      Admitted to [facility] with the diagnosis of hypokalemia. Lives alone. Sister is Colbert Ewing [phone]). Last seen Dr. Clide Deutscher 2018.     No home health in the past. No skilled facility. No equipment in the home.Takes care of all basic activities of daily living herself. No falls. Decreased appetite.  History of Alcohol abuse and cirrhosis of the liver. NPO - Endoscopy in progress           Action/Plan: Physical therapy is recommending skilled nursing facility. Declining this recommendation at this time.  Will discuss home health options when she returns to floor  Expected Discharge Date:                  Expected Discharge Plan:     In-House Referral:   yes  Discharge planning Services   yes  Post Acute Care Choice:    Choice offered to:     DME Arranged:    DME Agency:     HH Arranged:    [place] Agency:     Status of Service:     If discussed at [employer], dates discussed:    Additional Comments:  Shelbie Ammons, RN MSN CCM Care Management [phone] 07/28/2018, 11:48 AM

## 2018-07-29 ENCOUNTER — Encounter: Payer: Self-pay | Admitting: Gastroenterology

## 2018-07-29 LAB — SURGICAL PATHOLOGY

## 2018-07-29 NOTE — Care Management Important Message (Signed)
Important Message  Patient Details  Name: Morgan Mcpherson MRN: [id number] Date of Birth: 08/17/1946   Medicare Important Message Given:  Yes  Late entry.  IM given on Monday, 07/28/18 but forgot to document it.   Juliann Pulse A Saniya Tranchina 07/29/2018, 8:18 AM

## 2018-07-29 NOTE — Anesthesia Postprocedure Evaluation (Signed)
Anesthesia Post Note  Patient: Shakema Surita  Procedure(s) Performed: ESOPHAGOGASTRODUODENOSCOPY (EGD) WITH PROPOFOL (N/A ) COLONOSCOPY WITH PROPOFOL (N/A )  Patient location during evaluation: PACU Anesthesia Type: General Level of consciousness: awake and alert Pain management: pain level controlled Vital Signs Assessment: post-procedure vital signs reviewed and stable Respiratory status: spontaneous breathing, nonlabored ventilation and respiratory function stable Cardiovascular status: blood pressure returned to baseline and stable Postop Assessment: no apparent nausea or vomiting Anesthetic complications: no     Last Vitals:  Vitals:   07/28/18 1233 07/28/18 1318  BP: (!) 121/50 115/63  Pulse: 82 78  Resp: (!) 23 18  Temp:  36.9 C  SpO2: 97% 98%    Last Pain:  Vitals:   07/28/18 1233  TempSrc:   PainSc: 0-No pain                 Durenda Hurt

## 2018-07-30 LAB — VITAMIN E
VITAMIN E (ALPHA TOCOPHEROL): 6.2 mg/L — AB (ref 9.0–29.0)
Vitamin E(Gamma Tocopherol): 1.7 mg/L (ref 0.5–4.9)

## 2018-07-31 LAB — MISC LABCORP TEST (SEND OUT): Labcorp test code: 1479

## 2018-08-06 ENCOUNTER — Telehealth: Payer: Self-pay

## 2018-08-06 NOTE — Telephone Encounter (Signed)
Called pt to inform her of lab results and Dr. Georgeann Oppenheim instructions for pt to discuss medication and further evaluation with her PCP and GI provider. I have faxed pt results and result note to her PCP. Unable to contact pt LVM to return call

## 2018-08-06 NOTE — Telephone Encounter (Signed)
-----   Message from Jonathon Bellows, MD sent at 08/04/2018 11:24 AM EST ----- Sherald Hess can you inform patient and her PCP that her Vitamin E and C levels were very low. She will need supplementation and further evaluation: The patient needs to discuss with her PCP/GI ([facility] ) as she has already been discharged.   Please confirm  Dr Jonathon Bellows MD,MRCP [facility]) Gastroenterology/Hepatology Pager: [phone]

## 2018-08-12 ENCOUNTER — Ambulatory Visit: Payer: Medicare Other | Admitting: Gastroenterology

## 2018-08-15 NOTE — Telephone Encounter (Signed)
Spoke with pt and inform her of lab results and Dr. Georgeann Oppenheim instructions. I spoke with pt PCP office to verify they received the result notes and I was informed that pt primary provider prescribed pt Vit E and C supplements. Pt is aware. Pt states she's scheduled to see her PCP in January.

## 2018-09-24 ENCOUNTER — Ambulatory Visit: Payer: Medicare Other | Admitting: Gastroenterology

## 2018-10-10 ENCOUNTER — Encounter (INDEPENDENT_AMBULATORY_CARE_PROVIDER_SITE_OTHER): Payer: Medicare Other | Admitting: Vascular Surgery

## 2018-10-13 ENCOUNTER — Ambulatory Visit: Payer: Medicare Other | Admitting: Gastroenterology

## 2018-10-17 ENCOUNTER — Other Ambulatory Visit: Payer: Self-pay

## 2018-10-17 ENCOUNTER — Ambulatory Visit (INDEPENDENT_AMBULATORY_CARE_PROVIDER_SITE_OTHER): Payer: Medicare Other | Admitting: Vascular Surgery

## 2018-10-17 ENCOUNTER — Encounter (INDEPENDENT_AMBULATORY_CARE_PROVIDER_SITE_OTHER): Payer: Self-pay | Admitting: Vascular Surgery

## 2018-10-17 VITALS — BP 150/74 | HR 76 | Resp 12 | Ht 62.0 in | Wt 181.0 lb

## 2018-10-17 DIAGNOSIS — R600 Localized edema: Secondary | ICD-10-CM

## 2018-10-17 DIAGNOSIS — M7989 Other specified soft tissue disorders: Secondary | ICD-10-CM

## 2018-10-17 DIAGNOSIS — I10 Essential (primary) hypertension: Secondary | ICD-10-CM

## 2018-10-17 DIAGNOSIS — I890 Lymphedema, not elsewhere classified: Secondary | ICD-10-CM | POA: Diagnosis not present

## 2018-10-17 NOTE — Assessment & Plan Note (Signed)
blood pressure control important in reducing the progression of atherosclerotic disease. On appropriate oral medications.  

## 2018-10-17 NOTE — Assessment & Plan Note (Signed)
Quite prominent.  Not controlled with compression stockings.  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  She has a previous history of cellulitis and ulceration.  At this point, we are going to put her in 3 layer Unna boot's and these were placed bilaterally today.  We will change them weekly.  We will also get a venous reflux study in the next few weeks at her convenience.  Her swelling is quite pronounced, and I think she will need a lymphedema pump as an adjuvant therapy going forward as well.  Discussed the situation in great detail with the patient today and she voices her understanding.

## 2018-10-17 NOTE — Progress Notes (Signed)
Patient ID: Morgan Mcpherson, female   DOB: 13-Dec-1945, 73 y.o.   MRN: [id number]  Chief Complaint  Patient presents with  . New Patient (Initial Visit)    HPI Morgan Mcpherson is a 73 y.o. female.  I am asked to see the patient by Dr. Clide Deutscher for evaluation of lymphedema.  She was seen by my partner Dr. Delana Meyer back in 2015 as a consult over at the hospital.  I see a telephone message from 2017 but no visits in the past 3 years that I can find.  She has a previous history of significant ulceration and cellulitis of the legs.  The patient reports after she saw Dr. Delana Meyer in 2015, her legs got better for 2 or 3 years.  Over the last year or 2, her legs have gotten more swollen and discolored.  The skin has become very thickened and she has a large scab on the lateral aspect of the left lower leg.  The legs are very heavy and she has issues with balance.  She says she does wear compression stockings although she does not have them on today.   Past Medical History:  Diagnosis Date  . Cellulitis, leg   . Hypertension     Past Surgical History:  Procedure Laterality Date  . COLONOSCOPY WITH PROPOFOL N/A 07/28/2018   Procedure: COLONOSCOPY WITH PROPOFOL;  Surgeon: Virgel Manifold, MD;  Location: [facility] ENDOSCOPY;  Service: Endoscopy;  Laterality: N/A;  . ESOPHAGOGASTRODUODENOSCOPY (EGD) WITH PROPOFOL N/A 07/28/2018   Procedure: ESOPHAGOGASTRODUODENOSCOPY (EGD) WITH PROPOFOL;  Surgeon: Virgel Manifold, MD;  Location: [facility] ENDOSCOPY;  Service: Endoscopy;  Laterality: N/A;    Family History  Problem Relation Age of Onset  . Cancer Mother        unknown primary  No bleeding disorders, clotting disorders, or autoimmune diseases  Social History Social History   Tobacco Use  . Smoking status: Never Smoker  . Smokeless tobacco: Never Used  Substance Use Topics  . Alcohol use: Not Currently    Alcohol/week: 0.0 standard drinks    Comment: quit 06/2018  . Drug use: No     No Known  Allergies  Current Outpatient Medications  Medication Sig Dispense Refill  . calcium acetate (PHOSLO) 667 MG capsule Take 1 capsule (667 mg total) by mouth 3 (three) times daily with meals. 20 capsule 0  . NEOMYCIN-POLYMYXIN-HYDROCORTISONE (CORTISPORIN) 1 % SOLN otic solution Place 1 drop into the left ear 4 (four) times daily.    . pantoprazole (PROTONIX) 40 MG tablet Take 1 tablet (40 mg total) by mouth daily. 60 tablet 0  . potassium chloride (K-DUR) 10 MEQ tablet Take 1 tablet (10 mEq total) by mouth daily. 20 tablet 0  . tizanidine (ZANAFLEX) 2 MG capsule Take 2 mg by mouth 3 (three) times daily.     No current facility-administered medications for this visit.       REVIEW OF SYSTEMS (Negative unless checked)  Constitutional: []Weight loss  []Fever  []Chills Cardiac: []Chest pain   []Chest pressure   []Palpitations   []Shortness of breath when laying flat   []Shortness of breath at rest   []Shortness of breath with exertion. Vascular:  []Pain in legs with walking   []Pain in legs at rest   []Pain in legs when laying flat   []Claudication   []Pain in feet when walking  []Pain in feet at rest  []Pain in feet when laying flat   []History of DVT   []Phlebitis   [  x]Swelling in legs   []Varicose veins   [x]Non-healing ulcers Pulmonary:   []Uses home oxygen   []Productive cough   []Hemoptysis   []Wheeze  []COPD   []Asthma Neurologic:  []Dizziness  []Blackouts   []Seizures   []History of stroke   []History of TIA  []Aphasia   []Temporary blindness   []Dysphagia   []Weakness or numbness in arms   []Weakness or numbness in legs Musculoskeletal:  [x]Arthritis   []Joint swelling   []Joint pain   []Low back pain Hematologic:  []Easy bruising  []Easy bleeding   []Hypercoagulable state   []Anemic  []Hepatitis Gastrointestinal:  []Blood in stool   []Vomiting blood  []Gastroesophageal reflux/heartburn   []Abdominal pain Genitourinary:  []Chronic kidney disease   []Difficult urination  []Frequent  urination  []Burning with urination   []Hematuria Skin:  []Rashes   [x]Ulcers   [x]Wounds Psychological:  []History of anxiety   [] History of major depression.    Physical Exam BP (!) 150/74 (BP Location: Left Arm, Patient Position: Sitting, Cuff Size: Large)   Pulse 76   Resp 12   Ht 5' 2" (1.575 m)   Wt 181 lb (82.1 kg)   BMI 33.11 kg/m  Gen:  WD/WN, NAD. Obese  Head: [place]/AT, No temporalis wasting.  Ear/Nose/Throat: Hearing grossly intact, nares w/o erythema or drainage, oropharynx w/o Erythema/Exudate Eyes: Conjunctiva clear, sclera non-icteric  Neck: trachea midline.   Pulmonary:  Good air movement, respirations not labored, no use of accessory muscles Cardiac: RRR, no JVD Vascular:  Vessel Right Left  Radial Palpable Palpable                          PT Not Palpable Not Palpable  DP Not Palpable Not Palpable   Gastrointestinal: soft, non-tender/non-distended.  Musculoskeletal: M/S 5/5 throughout.  Extremities without ischemic changes.  No deformity or atrophy. 3+ BLE edema. Marked stasis dermatitis changes to both legs Neurologic: Sensation grossly intact in extremities.  Symmetrical.  Speech is fluent. Motor exam as listed above. Psychiatric: Judgment intact, Mood & affect appropriate for pt's clinical situation. Dermatologic: No rashes or ulcers noted.  No cellulitis or open wounds.    Radiology No results found.  Labs Recent Results (from the past 2160 hour(s))  Basic metabolic panel     Status: Abnormal   Collection Time: 07/25/18  9:54 AM  Result Value Ref Range   Sodium 134 (L) 135 - 145 mmol/L   Potassium <2.0 (LL) 3.5 - 5.1 mmol/L    Comment: CRITICAL RESULT CALLED TO, READ BACK BY AND VERIFIED WITH JEANNETTE PEREZ AT 1038 07/25/18.PMH   Chloride 81 (L) 98 - 111 mmol/L   CO2 35 (H) 22 - 32 mmol/L   Glucose, Bld 105 (H) 70 - 99 mg/dL   BUN 6 (L) 8 - 23 mg/dL   Creatinine, Ser 0.53 0.44 - 1.00 mg/dL   Calcium 6.3 (LL) 8.9 - 10.3 mg/dL    Comment:  CRITICAL RESULT CALLED TO, READ BACK BY AND VERIFIED WITH JEANNETTE PEREZ AT 1038 07/25/18.PMH   GFR calc non Af Amer >60 >60 mL/min   GFR calc Af Amer >60 >60 mL/min    Comment: (NOTE) The eGFR has been calculated using the CKD EPI equation. This calculation has not been validated in all clinical situations. eGFR's persistently <60 mL/min signify possible Chronic Kidney Disease.    Anion gap 18 (H) 5 - 15    Comment: Performed at [facility], [address] [place], [place] [address]  CBC     Status: Abnormal   Collection Time: 07/25/18  9:54 AM  Result Value Ref Range   WBC 5.1 4.0 - 10.5 K/uL   RBC 2.24 (L) 3.87 - 5.11 MIL/uL   Hemoglobin 6.7 (L) 12.0 - 15.0 g/dL   HCT 20.1 (L) 36.0 - 46.0 %   MCV 89.7 80.0 - 100.0 fL   MCH 29.9 26.0 - 34.0 pg   MCHC 33.3 30.0 - 36.0 g/dL   RDW 16.4 (H) 11.5 - 15.5 %   Platelets 223 150 - 400 K/uL   nRBC 0.0 0.0 - 0.2 %    Comment: Performed at [facility], [address] [place], [place] [address]  Hepatic function panel     Status: Abnormal   Collection Time: 07/25/18  9:54 AM  Result Value Ref Range   Total Protein 6.8 6.5 - 8.1 g/dL   Albumin 2.9 (L) 3.5 - 5.0 g/dL   AST 23 15 - 41 U/L   ALT 7 0 - 44 U/L   Alkaline Phosphatase 59 38 - 126 U/L   Total Bilirubin 1.6 (H) 0.3 - 1.2 mg/dL   Bilirubin, Direct 0.4 (H) 0.0 - 0.2 mg/dL   Indirect Bilirubin 1.2 (H) 0.3 - 0.9 mg/dL    Comment: Performed at [facility], [address] [place], [place] [address]  ABO/Rh     Status: None   Collection Time: 07/25/18  9:54 AM  Result Value Ref Range   ABO/RH(D)      B POS Performed at [facility], [address] [place], [place] [address]   Prepare RBC     Status: None   Collection Time: 07/25/18 10:51 AM  Result Value Ref Range   Order Confirmation      ORDER PROCESSED BY BLOOD BANK Performed at [facility], [address] [place], [place] [address]   Type and screen [place]     Status: None   Collection Time: 07/25/18 11:34 AM  Result Value Ref Range   ABO/RH(D) B POS    Antibody Screen NEG    Sample Expiration 07/28/2018    Unit Number [id number]    Blood Component Type RED CELLS,LR    Unit division 00    Status of Unit ISSUED,FINAL    Transfusion Status OK TO TRANSFUSE    Crossmatch Result Compatible    Unit Number [id number]    Blood Component Type RED CELLS,LR    Unit division 00    Status of Unit ISSUED,FINAL    Transfusion Status OK TO TRANSFUSE    Crossmatch Result      Compatible Performed at [facility], [address] [place], [place] [address]   Ammonia     Status: Abnormal   Collection Time: 07/25/18 11:34 AM  Result Value Ref Range   Ammonia <9 (L) 9 - 35 umol/L    Comment: Performed at [facility], [address] [place], [place] [address]  Protime-INR     Status: None   Collection Time: 07/25/18 11:34 AM  Result Value Ref Range   Prothrombin Time 14.1 11.4 - 15.2 seconds   INR 1.10     Comment: Performed at [facility], [address] [place], [place] [address]  BPAM RBC     Status: None   Collection Time: 07/25/18 11:34 AM  Result Value Ref Range   ISSUE DATE / TIME [id number]  Blood Product Unit Number [id number]    PRODUCT CODE E0336V00    Unit Type and Rh 7300    Blood Product Expiration Date [id number]    ISSUE DATE / TIME [id number]    Blood Product Unit Number [id number]    PRODUCT CODE E0336V00    Unit Type and Rh 7300    Blood Product Expiration Date [phone]   Hemoglobin and hematocrit, blood     Status: Abnormal   Collection Time: 07/25/18 10:35 PM  Result Value Ref Range   Hemoglobin 7.4 (L) 12.0 - 15.0 g/dL   HCT 21.7 (L) 36.0 - 46.0 %    Comment: Performed at [facility], [address] [place], [place] [address]  Folate     Status: Abnormal   Collection Time: 07/25/18 10:36 PM  Result Value Ref Range   Folate  3.5 (L) >5.9 ng/mL    Comment: Performed at [facility], [address] [place], [place] [address]  Iron and TIBC     Status: Abnormal   Collection Time: 07/25/18 10:36 PM  Result Value Ref Range   Iron 137 28 - 170 ug/dL   TIBC 153 (L) 250 - 450 ug/dL   Saturation Ratios 90 (H) 10.4 - 31.8 %   UIBC 16 ug/dL    Comment: Performed at [facility], [address] [place], [place] [address]  Ferritin     Status: Abnormal   Collection Time: 07/25/18 10:36 PM  Result Value Ref Range   Ferritin 501 (H) 11 - 307 ng/mL    Comment: Performed at [facility], [address] [place], [place] [address]  Reticulocytes     Status: Abnormal   Collection Time: 07/25/18 10:36 PM  Result Value Ref Range   Retic Ct Pct 0.6 0.4 - 3.1 %   RBC. 2.42 (L) 3.87 - 5.11 MIL/uL   Retic Count, Absolute 15.5 (L) 19.0 - 186.0 K/uL   Immature Retic Fract 11.6 2.3 - 15.9 %    Comment: Performed at [facility], [address] [place], [place] [address]  Magnesium     Status: Abnormal   Collection Time: 07/25/18 10:36 PM  Result Value Ref Range   Magnesium 1.5 (L) 1.7 - 2.4 mg/dL    Comment: Performed at [facility], [address] [place], [place] [address]  Basic metabolic panel     Status: Abnormal   Collection Time: 07/25/18 10:36 PM  Result Value Ref Range   Sodium 135 135 - 145 mmol/L   Potassium 2.6 (LL) 3.5 - 5.1 mmol/L    Comment: CRITICAL RESULT CALLED TO, READ BACK BY AND VERIFIED WITH YAKANA COOSS 07/25/18 2330 JML    Chloride 87 (L) 98 - 111 mmol/L   CO2 39 (H) 22 - 32 mmol/L   Glucose, Bld 101 (H) 70 - 99 mg/dL   BUN 5 (L) 8 - 23 mg/dL   Creatinine, Ser 0.40 (L) 0.44 - 1.00 mg/dL   Calcium 6.1 (LL) 8.9 - 10.3 mg/dL    Comment: CRITICAL RESULT CALLED TO, READ BACK BY AND VERIFIED WITH YAKANA COOSS 07/25/18 2330 JML    GFR calc non Af Amer >60 >60 mL/min   GFR calc Af Amer >60 >60 mL/min    Comment: (NOTE) The eGFR has been calculated  using the CKD EPI equation. This calculation has not been validated in all clinical situations. eGFR's persistently <60 mL/min signify possible Chronic Kidney Disease.    Anion gap 9 5 -  15    Comment: Performed at [facility], [address] [place], [place] [address]  Vitamin E     Status: Abnormal   Collection Time: 07/25/18 10:36 PM  Result Value Ref Range   Vitamin E (Alpha Tocopherol) 6.2 (L) 9.0 - 29.0 mg/L    Comment: (NOTE) This test was developed and its performance characteristics determined by [facility]. It has not been cleared or approved by the Food and Drug Administration.    Vitamin E(Gamma Tocopherol) 1.7 0.5 - 4.9 mg/L    Comment: (NOTE) This test was developed and its performance characteristics determined by [facility]. It has not been cleared or approved by the Food and Drug Administration. Reference intervals for alpha and gamma-tocopherol determined from [facility] and Nutrition Examination Survey, 2005-2006. Individuals with alpha-tocopherol levels less than 5.0 mg/L are considered vitamin E deficient. Performed At: [facility] [address] [place], [place] [id number] Rush Farmer MD [id number]   Phosphorus     Status: Abnormal   Collection Time: 07/25/18 10:36 PM  Result Value Ref Range   Phosphorus 1.2 (L) 2.5 - 4.6 mg/dL    Comment: Performed at [facility], [address] [place], [place] [address]  Vitamin B12     Status: None   Collection Time: 07/25/18 10:38 PM  Result Value Ref Range   Vitamin B-12 442 180 - 914 pg/mL    Comment: (NOTE) This assay is not validated for testing neonatal or myeloproliferative syndrome specimens for Vitamin B12 levels. Performed at [facility], [place] [address] [place] [address]   CBC     Status: Abnormal   Collection Time: 07/26/18  3:33 AM  Result Value Ref Range   WBC 5.0 4.0 - 10.5 K/uL   RBC 2.42 (L) 3.87 - 5.11 MIL/uL   Hemoglobin 7.2 (L) 12.0 -  15.0 g/dL   HCT 21.6 (L) 36.0 - 46.0 %   MCV 89.3 80.0 - 100.0 fL   MCH 29.8 26.0 - 34.0 pg   MCHC 33.3 30.0 - 36.0 g/dL   RDW 15.5 11.5 - 15.5 %   Platelets 184 150 - 400 K/uL   nRBC 0.0 0.0 - 0.2 %    Comment: Performed at [facility], [address] [place], [place] [address]  Miscellaneous [facility] test (send-out)     Status: None   Collection Time: 07/26/18  3:33 AM  Result Value Ref Range   [facility] test code [id number]    [facility] test name Vitamin C     Comment: Performed at [facility], [address], [place],  [address]   [place] result COMMENT     Comment: (NOTE) Test Ordered: [id number] Vitamin C Vitamin C                      <0.1        Parish.[place]rickson ] mg/dL    BN     Reference Range: 0.2-2.0                               Performed At: [facility] [place], [place] [id number] Rush Farmer MD MO:[id number]   Basic metabolic panel     Status: Abnormal   Collection Time: 07/26/18  3:33 AM  Result Value Ref Range   Sodium 135 135 - 145 mmol/L   Potassium 2.3 (LL) 3.5 - 5.1 mmol/L  Comment: CRITICAL RESULT CALLED TO, READ BACK BY AND VERIFIED WITH YAKANA CROSS ON 07/26/18 AT 0504 [facility]    Chloride 88 (L) 98 - 111 mmol/L   CO2 39 (H) 22 - 32 mmol/L   Glucose, Bld 111 (H) 70 - 99 mg/dL   BUN 6 (L) 8 - 23 mg/dL   Creatinine, Ser 0.35 (L) 0.44 - 1.00 mg/dL   Calcium 6.4 (LL) 8.9 - 10.3 mg/dL    Comment: CRITICAL RESULT CALLED TO, READ BACK BY AND VERIFIED WITH YAKANA CROSS ON 07/26/18 AT 0504 [place]    GFR calc non Af Amer >60 >60 mL/min   GFR calc Af Amer >60 >60 mL/min    Comment: (NOTE) The eGFR has been calculated using the CKD EPI equation. This calculation has not been validated in all clinical situations. eGFR's persistently <60 mL/min signify possible Chronic Kidney Disease.    Anion gap 8 5 - 15    Comment: Performed at [facility], [address] [place], [place] [address]  Magnesium     Status: Abnormal    Collection Time: 07/26/18  3:33 AM  Result Value Ref Range   Magnesium 2.8 (H) 1.7 - 2.4 mg/dL    Comment: Performed at [facility], [address] [place], [place] [address]  Phosphorus     Status: None   Collection Time: 07/26/18  3:33 AM  Result Value Ref Range   Phosphorus 2.5 2.5 - 4.6 mg/dL    Comment: Performed at [facility], [address] [place], [place] [address]  Prepare RBC     Status: None   Collection Time: 07/26/18  7:14 AM  Result Value Ref Range   Order Confirmation      ORDER PROCESSED BY BLOOD BANK Performed at [facility], [address] [place], [place] [address]   Renal function panel     Status: Abnormal   Collection Time: 07/26/18 10:31 AM  Result Value Ref Range   Sodium 136 135 - 145 mmol/L   Potassium 2.6 (LL) 3.5 - 5.1 mmol/L    Comment: CRITICAL RESULT CALLED TO, READ BACK BY AND VERIFIED WITH ASHLEY ROMERO_0  ON 07/26/18 BY HKP    Chloride 90 (L) 98 - 111 mmol/L   CO2 34 (H) 22 - 32 mmol/L   Glucose, Bld 111 (H) 70 - 99 mg/dL   BUN <5 (L) 8 - 23 mg/dL   Creatinine, Ser 0.47 0.44 - 1.00 mg/dL   Calcium 6.3 (LL) 8.9 - 10.3 mg/dL    Comment: CRITICAL RESULT CALLED TO, READ BACK BY AND VERIFIED WITH ASHLEY ROMERO_1  ON 07/26/18 BY HKP    Phosphorus 2.3 (L) 2.5 - 4.6 mg/dL   Albumin 2.4 (L) 3.5 - 5.0 g/dL   GFR calc non Af Amer >60 >60 mL/min   GFR calc Af Amer >60 >60 mL/min    Comment: (NOTE) The eGFR has been calculated using the CKD EPI equation. This calculation has not been validated in all clinical situations. eGFR's persistently <60 mL/min signify possible Chronic Kidney Disease.    Anion gap 12 5 - 15    Comment: Performed at [facility], [address] [place], [place] [address]  Magnesium     Status: None   Collection Time: 07/26/18 10:31 AM  Result Value Ref Range   Magnesium 2.0 1.7 - 2.4 mg/dL    Comment: Performed at [facility], [address]  [place], [place] [address]  Calcium, ionized  Status: Abnormal   Collection Time: 07/26/18 10:31 AM  Result Value Ref Range   Calcium, Ionized, Serum 3.8 (L) 4.5 - 5.6 mg/dL    Comment: (NOTE) Performed At: [facility] [place], [place] [id number] Rush Farmer MD IF:[id number]   Potassium     Status: Abnormal   Collection Time: 07/26/18  6:59 PM  Result Value Ref Range   Potassium 3.1 (L) 3.5 - 5.1 mmol/L    Comment: Performed at [facility], [address] [place], [place] [address]  Calcium     Status: Abnormal   Collection Time: 07/26/18  6:59 PM  Result Value Ref Range   Calcium 6.5 (L) 8.9 - 10.3 mg/dL    Comment: Performed at [facility], [address] [place], [place] [address]  Hemoglobin and hematocrit, blood     Status: Abnormal   Collection Time: 07/26/18  6:59 PM  Result Value Ref Range   Hemoglobin 8.5 (L) 12.0 - 15.0 g/dL   HCT 25.6 (L) 36.0 - 46.0 %    Comment: Performed at [facility], [address], [place], [place] [address]  Phosphorus     Status: Abnormal   Collection Time: 07/26/18 10:00 PM  Result Value Ref Range   Phosphorus 1.8 (L) 2.5 - 4.6 mg/dL    Comment: Performed at [facility], [address] [place], [place] [address]  Magnesium     Status: None   Collection Time: 07/26/18 10:00 PM  Result Value Ref Range   Magnesium 1.7 1.7 - 2.4 mg/dL    Comment: Performed at [facility], [address] [place], [place] [address]  Basic metabolic panel     Status: Abnormal   Collection Time: 07/27/18  9:58 AM  Result Value Ref Range   Sodium 137 135 - 145 mmol/L   Potassium 3.1 (L) 3.5 - 5.1 mmol/L   Chloride 96 (L) 98 - 111 mmol/L   CO2 29 22 - 32 mmol/L   Glucose, Bld 110 (H) 70 - 99 mg/dL   BUN <5 (L) 8 - 23 mg/dL   Creatinine, Ser 0.41 (L) 0.44 - 1.00 mg/dL   Calcium 7.0 (L) 8.9 - 10.3 mg/dL   GFR calc non Af Amer >60 >60 mL/min   GFR calc Af Amer >60 >60 mL/min     Comment: (NOTE) The eGFR has been calculated using the CKD EPI equation. This calculation has not been validated in all clinical situations. eGFR's persistently <60 mL/min signify possible Chronic Kidney Disease.    Anion gap 12 5 - 15    Comment: Performed at [facility], [address] [place], [place] [address]  Phosphorus     Status: None   Collection Time: 07/27/18  9:58 AM  Result Value Ref Range   Phosphorus 2.9 2.5 - 4.6 mg/dL    Comment: Performed at [facility], [address] [place], [place] [address]  Basic metabolic panel     Status: Abnormal   Collection Time: 07/28/18  3:50 AM  Result Value Ref Range   Sodium 139 135 - 145 mmol/L   Potassium 3.4 (L) 3.5 - 5.1 mmol/L   Chloride 104 98 - 111 mmol/L   CO2 29 22 - 32 mmol/L   Glucose, Bld 105 (H) 70 - 99 mg/dL   BUN <5 (L) 8 - 23 mg/dL   Creatinine, Ser 0.34 (L) 0.44 - 1.00 mg/dL   Calcium 6.6 (L) 8.9 - 10.3 mg/dL  GFR calc non Af Amer >60 >60 mL/min   GFR calc Af Amer >60 >60 mL/min    Comment: (NOTE) The eGFR has been calculated using the CKD EPI equation. This calculation has not been validated in all clinical situations. eGFR's persistently <60 mL/min signify possible Chronic Kidney Disease.    Anion gap 6 5 - 15    Comment: Performed at [facility], [address] [place], [place] [address]  Phosphorus     Status: Abnormal   Collection Time: 07/28/18  3:50 AM  Result Value Ref Range   Phosphorus 1.7 (L) 2.5 - 4.6 mg/dL    Comment: Performed at [facility], [address] [place], [place] [address]  CBC     Status: Abnormal   Collection Time: 07/28/18  3:50 AM  Result Value Ref Range   WBC 4.9 4.0 - 10.5 K/uL   RBC 2.74 (L) 3.87 - 5.11 MIL/uL   Hemoglobin 8.1 (L) 12.0 - 15.0 g/dL   HCT 24.1 (L) 36.0 - 46.0 %   MCV 88.0 80.0 - 100.0 fL   MCH 29.6 26.0 - 34.0 pg   MCHC 33.6 30.0 - 36.0 g/dL   RDW 16.1 (H) 11.5 - 15.5 %   Platelets 203 150 - 400 K/uL    nRBC 0.0 0.0 - 0.2 %    Comment: Performed at [facility], [address] [place], [place] [address]  Magnesium     Status: Abnormal   Collection Time: 07/28/18  3:50 AM  Result Value Ref Range   Magnesium 1.3 (L) 1.7 - 2.4 mg/dL    Comment: Performed at [facility], [address] [place], [place] [address]  Surgical pathology     Status: None   Collection Time: 07/28/18 11:11 AM  Result Value Ref Range   SURGICAL PATHOLOGY      Surgical Pathology CASE: [id number] PATIENT: Arbadella Stimson Surgical Pathology Report     SPECIMEN SUBMITTED: A. Stomach, antrum, thickening and erythema; cbx B. Colon polyp x2, cecum; cbx C. Colon polyp, descending; cold snare  CLINICAL HISTORY: None provided  PRE-OPERATIVE DIAGNOSIS: Anemia  POST-OPERATIVE DIAGNOSIS: Gastric antrum thickening and erythema, colon polyps, diverticulosis     DIAGNOSIS: A. STOMACH, ANTRUM; COLD BIOPSY: - GASTRIC ANTRAL MUCOSA WITH REACTIVE GASTROPATHY. - NEGATIVE FOR H. PYLORI, DYSPLASIA, AND MALIGNANCY.  Comment: The differential diagnosis for these findings includes drugs/chemical injury (NSAIDs vs. other), bile reflux, and changes adjacent to an area of healing ulceration. Clinical correlation with endoscopic findings is required.  B. COLON POLYPS X2, CECUM; COLD BIOPSY: - FRAGMENTS (X3) OF TUBULAR ADENOMAS. - FRAGMENTS (X2) OF UNREMARKABLE COLONIC MUCOSA. - NEGATIVE FOR HIGH-GRADE DYSPLASIA AND MALIGNANCY.  C . COLON POLYP, DESCENDING; COLD SNARE: - TUBULAR ADENOMA. - NEGATIVE FOR HIGH-GRADE DYSPLASIA AND MALIGNANCY.  GROSS DESCRIPTION: +A. Labeled: Cbx gastric antrum thickening and erythema Received: In formalin Tissue fragment(s): 2 Size: Six 0.3 and 0.4 cm Description: Pink-tan fragments Entirely submitted in 1 cassette.  B. Labeled: Cbx cecal polyp x2 Received: In formalin Tissue fragment(s): 4 Size: 0.2-0.3 cm Description: Pink-tan fragments Entirely  submitted in 1 cassette.  C. Labeled: Cold snare descending colon polyp Received: In formalin Tissue fragment(s): 1 Size: 0.5 cm Description: Tan fragment Entirely submitted in 1 cassette.    Final Diagnosis performed by Allena Napoleon, MD.   Electronically signed 07/29/2018 2:49:57PM The electronic signature indicates that the named Attending Pathologist has evaluated the specimen  Technical component performed at [facility], [address] [place], [place] [address]  Lab: [phone] Dir: Rush Farmer, MD, MMM   Professional component performed at [facility], [facility], [place], [place], [place] [address] Lab: [phone] Dir: Dellia Nims. Rubinas, MD     Assessment/Plan:  Essential hypertension, benign blood pressure control important in reducing the progression of atherosclerotic disease. On appropriate oral medications.   Swelling of limb Quite prominent.  Not controlled with compression stockings.  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  She has a previous history of cellulitis and ulceration.  At this point, we are going to put her in 3 layer Unna boot's and these were placed bilaterally today.  We will change them weekly.  We will also get a venous reflux study in the next few weeks at her convenience.  Her swelling is quite pronounced, and I think she will need a lymphedema pump as an adjuvant therapy going forward as well.  Discussed the situation in great detail with the patient today and she voices her understanding.  Lymphedema Quite prominent.  Not controlled with compression stockings.  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  She has a previous history of cellulitis and ulceration.  At this point, we are going to put her in 3 layer Unna boot's and these were placed bilaterally today.  We will change them weekly.  We will also get a venous reflux study in  the next few weeks at her convenience.  Her swelling is quite pronounced, and I think she will need a lymphedema pump as an adjuvant therapy going forward as well.  Discussed the situation in great detail with the patient today and she voices her understanding.      Leotis Pain 10/17/2018, 10:11 AM   This note was created with Dragon medical transcription system.  Any errors from dictation are unintentional.

## 2018-10-17 NOTE — Patient Instructions (Signed)

## 2018-10-22 ENCOUNTER — Telehealth (INDEPENDENT_AMBULATORY_CARE_PROVIDER_SITE_OTHER): Payer: Self-pay | Admitting: Nurse Practitioner

## 2018-10-23 NOTE — Telephone Encounter (Signed)
All information was faxed to [facility] yesterday to start homehealth changing the unna boots.

## 2018-10-24 ENCOUNTER — Encounter (INDEPENDENT_AMBULATORY_CARE_PROVIDER_SITE_OTHER): Payer: Medicare Other

## 2018-10-31 ENCOUNTER — Encounter (INDEPENDENT_AMBULATORY_CARE_PROVIDER_SITE_OTHER): Payer: Medicare Other

## 2018-11-07 ENCOUNTER — Encounter (INDEPENDENT_AMBULATORY_CARE_PROVIDER_SITE_OTHER): Payer: Self-pay | Admitting: Nurse Practitioner

## 2018-11-07 ENCOUNTER — Ambulatory Visit (INDEPENDENT_AMBULATORY_CARE_PROVIDER_SITE_OTHER): Payer: Medicare Other | Admitting: Nurse Practitioner

## 2018-11-07 ENCOUNTER — Other Ambulatory Visit: Payer: Self-pay

## 2018-11-07 ENCOUNTER — Ambulatory Visit (INDEPENDENT_AMBULATORY_CARE_PROVIDER_SITE_OTHER): Payer: Medicare Other

## 2018-11-07 VITALS — BP 162/84 | HR 80 | Resp 12 | Ht 62.0 in | Wt 184.0 lb

## 2018-11-07 DIAGNOSIS — Z79899 Other long term (current) drug therapy: Secondary | ICD-10-CM | POA: Diagnosis not present

## 2018-11-07 DIAGNOSIS — I890 Lymphedema, not elsewhere classified: Secondary | ICD-10-CM

## 2018-11-07 DIAGNOSIS — I10 Essential (primary) hypertension: Secondary | ICD-10-CM

## 2018-11-07 NOTE — Progress Notes (Signed)
SUBJECTIVE:  Patient ID: Morgan Mcpherson, female    DOB: 1946-02-03, 73 y.o.   MRN: [id number] Chief Complaint  Patient presents with  . Follow-up    HPI  Morgan Mcpherson is a 73 y.o. female The patient returns to the office for followup evaluation regarding leg swelling.  Patient has been in [place] wraps for the last 4 weeks.  The swelling has improved quite a bit and the pain associated with swelling has decreased substantially. There have not been any interval development of a ulcerations or wounds.  Today the patient is wearing her medical grade 1 compression stockings.  Since the previous visit the patient has been wearing Unna wraps and has noted little significant improvement in the lymphedema. The patient has been using compression routinely morning until night.  The patient also states elevation during the day and exercise is being done too.   Patient underwent a bilateral lower extremity venous reflux study which revealed that the patient had no evidence of chronic venous insufficiency, DVT, or superficial venous thrombosis bilaterally.  Past Medical History:  Diagnosis Date  . Cellulitis, leg   . Hypertension     Past Surgical History:  Procedure Laterality Date  . COLONOSCOPY WITH PROPOFOL N/A 07/28/2018   Procedure: COLONOSCOPY WITH PROPOFOL;  Surgeon: Virgel Manifold, MD;  Location: [facility] ENDOSCOPY;  Service: Endoscopy;  Laterality: N/A;  . ESOPHAGOGASTRODUODENOSCOPY (EGD) WITH PROPOFOL N/A 07/28/2018   Procedure: ESOPHAGOGASTRODUODENOSCOPY (EGD) WITH PROPOFOL;  Surgeon: Virgel Manifold, MD;  Location: [facility] ENDOSCOPY;  Service: Endoscopy;  Laterality: N/A;    Social History   Socioeconomic History  . Marital status: Single    Spouse name: Not on file  . Number of children: 0  . Years of education: Not on file  . Highest education level: Not on file  Occupational History  . Occupation: Psychologist, clinical, retired  Scientific laboratory technician  . Financial resource strain: Patient  refused  . Food insecurity:    Worry: Patient refused    Inability: Patient refused  . Transportation needs:    Medical: Patient refused    Non-medical: Patient refused  Tobacco Use  . Smoking status: Never Smoker  . Smokeless tobacco: Never Used  Substance and Sexual Activity  . Alcohol use: Not Currently    Alcohol/week: 0.0 standard drinks    Comment: quit 06/2018  . Drug use: No  . Sexual activity: Not Currently  Lifestyle  . Physical activity:    Days per week: Patient refused    Minutes per session: Patient refused  . Stress: Patient refused  Relationships  . Social connections:    Talks on phone: Patient refused    Gets together: Patient refused    Attends religious service: Patient refused    Active member of club or organization: Patient refused    Attends meetings of clubs or organizations: Patient refused    Relationship status: Patient refused  . Intimate partner violence:    Fear of current or ex partner: Patient refused    Emotionally abused: Patient refused    Physically abused: Patient refused    Forced sexual activity: Patient refused  Other Topics Concern  . Not on file  Social History Narrative  . Not on file    Family History  Problem Relation Age of Onset  . Cancer Mother        unknown primary    No Known Allergies   Review of Systems   Review of Systems: Negative Unless Checked Constitutional: [] Weight loss  []   Fever  [] Chills Cardiac: [] Chest pain   []  Atrial Fibrillation  [] Palpitations   [] Shortness of breath when laying flat   [] Shortness of breath with exertion. [] Shortness of breath at rest Vascular:  [] Pain in legs with walking   [] Pain in legs with standing [] Pain in legs when laying flat   [] Claudication    [] Pain in feet when laying flat    [] History of DVT   [] Phlebitis   [x] Swelling in legs   [] Varicose veins   [] Non-healing ulcers Pulmonary:   [] Uses home oxygen   [] Productive cough   [] Hemoptysis   [] Wheeze  [] COPD    [] Asthma Neurologic:  [] Dizziness   [] Seizures  [] Blackouts [] History of stroke   [] History of TIA  [] Aphasia   [] Temporary Blindness   [] Weakness or numbness in arm   [] Weakness or numbness in leg Musculoskeletal:   [] Joint swelling   [] Joint pain   [] Low back pain  []  History of Knee Replacement [] Arthritis [] back Surgeries  []  Spinal Stenosis    Hematologic:  [] Easy bruising  [] Easy bleeding   [] Hypercoagulable state   [x] Anemic Gastrointestinal:  [] Diarrhea   [] Vomiting  [] Gastroesophageal reflux/heartburn   [] Difficulty swallowing. [] Abdominal pain Genitourinary:  [] Chronic kidney disease   [] Difficult urination  [] Anuric   [] Blood in urine [] Frequent urination  [] Burning with urination   [] Hematuria Skin:  [] Rashes   [] Ulcers [] Wounds Psychological:  [] History of anxiety   []  History of major depression  []  Memory Difficulties      OBJECTIVE:   Physical Exam  BP (!) 162/84 (BP Location: Left Arm, Patient Position: Sitting, Cuff Size: Large)   Pulse 80   Resp 12   Ht 5\' 2"  (1.575 m)   Wt 184 lb (83.5 kg)   BMI 33.65 kg/m   Gen: WD/WN, NAD Head: [place]/AT, No temporalis wasting.  Ear/Nose/Throat: Hearing grossly intact, nares w/o erythema or drainage Eyes: PER, EOMI, sclera nonicteric.  Neck: Supple, no masses.  No JVD.  Pulmonary:  Good air movement, no use of accessory muscles.  Cardiac: RRR Vascular: 3+ soft edema  Vessel Right Left  Radial Palpable Palpable  Dorsalis Pedis Not Palpable Not Palpable  Posterior Tibial Not Palpable Not Palpable   Gastrointestinal: soft, non-distended. No guarding/no peritoneal signs.  Musculoskeletal: M/S 5/5 throughout.  No deformity or atrophy.  Neurologic: Pain and light touch intact in extremities.  Symmetrical.  Speech is fluent. Motor exam as listed above. Psychiatric: Judgment intact, Mood & affect appropriate for pt's clinical situation. Dermatologic: No Venous rashes. No Ulcers Noted.  No changes consistent with cellulitis. Lymph :  No Cervical lymphadenopathy,Dermal thickening bilaterally with lichenification      ASSESSMENT AND PLAN:  1. Lymphedema  No surgery or intervention at this point in time.    I have reviewed my discussion with the patient regarding lymphedema and why it  causes symptoms.  Patient will continue wearing graduated compression stockings class 1 (20-30 mmHg) on a daily basis a prescription was given. The patient is reminded to put the stockings on first thing in the morning and removing them in the evening. The patient is instructed specifically not to sleep in the stockings.   In addition, behavioral modification throughout the day will be continued.  This will include frequent elevation (such as in a recliner), use of over the counter pain medications as needed and exercise such as walking.  I have reviewed systemic causes for chronic edema such as liver, kidney and cardiac etiologies and there does not appear to be  any significant changes in these organ systems over the past year.  The patient is under the impression that these organ systems are all stable and unchanged.    The patient will continue aggressive use of the  lymph pump.  This will continue to improve the edema control and prevent sequela such as ulcers and infections.   The patient will follow-up with me on an annual basis.    2. Essential hypertension, benign Continue antihypertensive medications as already ordered, these medications have been reviewed and there are no changes at this time.    Current Outpatient Medications on File Prior to Visit  Medication Sig Dispense Refill  . calcium acetate (PHOSLO) 667 MG capsule Take 1 capsule (667 mg total) by mouth 3 (three) times daily with meals. 20 capsule 0  . NEOMYCIN-POLYMYXIN-HYDROCORTISONE (CORTISPORIN) 1 % SOLN otic solution Place 1 drop into the left ear 4 (four) times daily.    . pantoprazole (PROTONIX) 40 MG tablet Take 1 tablet (40 mg total) by mouth daily. 60 tablet 0   . potassium chloride (K-DUR) 10 MEQ tablet Take 1 tablet (10 mEq total) by mouth daily. 20 tablet 0  . tizanidine (ZANAFLEX) 2 MG capsule Take 2 mg by mouth 3 (three) times daily.     No current facility-administered medications on file prior to visit.     There are no Patient Instructions on file for this visit. No follow-ups on file.   Kris Hartmann, NP  This note was completed with Sales executive.  Any errors are purely unintentional.

## 2019-03-22 ENCOUNTER — Other Ambulatory Visit: Payer: Self-pay

## 2019-03-22 ENCOUNTER — Emergency Department: Payer: Medicare Other

## 2019-03-22 ENCOUNTER — Emergency Department
Admission: EM | Admit: 2019-03-22 | Discharge: 2019-03-22 | Disposition: A | Discharge status: Home / Self Care | Payer: Medicare Other | Attending: Emergency Medicine | Admitting: Emergency Medicine

## 2019-03-22 ENCOUNTER — Encounter: Payer: Self-pay | Admitting: Emergency Medicine

## 2019-03-22 DIAGNOSIS — F10929 Alcohol use, unspecified with intoxication, unspecified: Secondary | ICD-10-CM | POA: Diagnosis not present

## 2019-03-22 DIAGNOSIS — Y908 Blood alcohol level of 240 mg/100 ml or more: Secondary | ICD-10-CM | POA: Diagnosis not present

## 2019-03-22 DIAGNOSIS — Y9301 Activity, walking, marching and hiking: Secondary | ICD-10-CM | POA: Insufficient documentation

## 2019-03-22 DIAGNOSIS — S0990XA Unspecified injury of head, initial encounter: Secondary | ICD-10-CM | POA: Diagnosis present

## 2019-03-22 DIAGNOSIS — I10 Essential (primary) hypertension: Secondary | ICD-10-CM | POA: Diagnosis not present

## 2019-03-22 DIAGNOSIS — W010XXA Fall on same level from slipping, tripping and stumbling without subsequent striking against object, initial encounter: Secondary | ICD-10-CM | POA: Diagnosis not present

## 2019-03-22 DIAGNOSIS — S0012XA Contusion of left eyelid and periocular area, initial encounter: Secondary | ICD-10-CM | POA: Insufficient documentation

## 2019-03-22 DIAGNOSIS — W19XXXA Unspecified fall, initial encounter: Secondary | ICD-10-CM

## 2019-03-22 DIAGNOSIS — H05232 Hemorrhage of left orbit: Secondary | ICD-10-CM

## 2019-03-22 DIAGNOSIS — Y999 Unspecified external cause status: Secondary | ICD-10-CM | POA: Insufficient documentation

## 2019-03-22 DIAGNOSIS — Y92018 Other place in single-family (private) house as the place of occurrence of the external cause: Secondary | ICD-10-CM | POA: Insufficient documentation

## 2019-03-22 DIAGNOSIS — F10920 Alcohol use, unspecified with intoxication, uncomplicated: Secondary | ICD-10-CM

## 2019-03-22 HISTORY — DX: Gastro-esophageal reflux disease without esophagitis: K21.9

## 2019-03-22 LAB — CBC WITH DIFFERENTIAL/PLATELET
Abs Immature Granulocytes: 0 10*3/uL (ref 0.00–0.07)
Basophils Absolute: 0 10*3/uL (ref 0.0–0.1)
Basophils Relative: 1 %
Eosinophils Absolute: 0 10*3/uL (ref 0.0–0.5)
Eosinophils Relative: 1 %
HCT: 36 % (ref 36.0–46.0)
Hemoglobin: 11.9 g/dL — ABNORMAL LOW (ref 12.0–15.0)
Immature Granulocytes: 0 %
Lymphocytes Relative: 44 %
Lymphs Abs: 1 10*3/uL (ref 0.7–4.0)
MCH: 30.4 pg (ref 26.0–34.0)
MCHC: 33.1 g/dL (ref 30.0–36.0)
MCV: 92.1 fL (ref 80.0–100.0)
Monocytes Absolute: 0.1 10*3/uL (ref 0.1–1.0)
Monocytes Relative: 4 %
Neutro Abs: 1.2 10*3/uL — ABNORMAL LOW (ref 1.7–7.7)
Neutrophils Relative %: 50 %
Platelets: 125 10*3/uL — ABNORMAL LOW (ref 150–400)
RBC: 3.91 MIL/uL (ref 3.87–5.11)
RDW: 15.3 % (ref 11.5–15.5)
WBC: 2.3 10*3/uL — ABNORMAL LOW (ref 4.0–10.5)
nRBC: 0 % (ref 0.0–0.2)

## 2019-03-22 LAB — COMPREHENSIVE METABOLIC PANEL
ALT: 20 U/L (ref 0–44)
AST: 57 U/L — ABNORMAL HIGH (ref 15–41)
Albumin: 3.8 g/dL (ref 3.5–5.0)
Alkaline Phosphatase: 102 U/L (ref 38–126)
Anion gap: 13 (ref 5–15)
BUN: 7 mg/dL — ABNORMAL LOW (ref 8–23)
CO2: 30 mmol/L (ref 22–32)
Calcium: 8.1 mg/dL — ABNORMAL LOW (ref 8.9–10.3)
Chloride: 99 mmol/L (ref 98–111)
Creatinine, Ser: 0.45 mg/dL (ref 0.44–1.00)
GFR calc Af Amer: >60 mL/min (ref >60)
GFR calc non Af Amer: >60 mL/min (ref >60)
Glucose, Bld: 98 mg/dL (ref 70–99)
Potassium: 3 mmol/L — ABNORMAL LOW (ref 3.5–5.1)
Sodium: 142 mmol/L (ref 135–145)
Total Bilirubin: 0.6 mg/dL (ref 0.3–1.2)
Total Protein: 7.5 g/dL (ref 6.5–8.1)

## 2019-03-22 LAB — LIPASE, BLOOD: Lipase: 17 U/L (ref 11–51)

## 2019-03-22 LAB — ETHANOL: Alcohol, Ethyl (B): 344 mg/dL (ref <10)

## 2019-03-22 MED ORDER — SODIUM CHLORIDE 0.9 % IV BOLUS
1000.0000 mL | Freq: Once | INTRAVENOUS | Status: AC
  Administered 2019-03-22: 1000 mL via INTRAVENOUS

## 2019-03-22 MED ORDER — ACETAMINOPHEN 325 MG PO TABS
650.0000 mg | ORAL_TABLET | Freq: Once | ORAL | Status: AC
  Administered 2019-03-22: 650 mg via ORAL
  Filled 2019-03-22: qty 2

## 2019-03-22 NOTE — ED Notes (Signed)
Pt on the phone speaking to daughter.

## 2019-03-22 NOTE — ED Provider Notes (Signed)
-----------------------------------------   12:25 PM on 03/22/2019 -----------------------------------------  I took over care on this patient from Dr. Beather Arbour.  Alcohol level was 344.  The other lab work-up is unremarkable.  On reassessment, the patient is now alert and clinically sober.  She is stable for discharge and states that she wants to go home.  She reports a headache but denies other acute complaints.  I counseled her on the results of the work-up.  Return precautions given, and she expresses understanding.   Arta Silence, MD 03/22/19 1226

## 2019-03-22 NOTE — ED Notes (Signed)
Date and time results received: 03/22/19 7:59 AM  Test: ETOH Critical Value: 344  Name of Provider Notified: Cherylann Banas, MD

## 2019-03-22 NOTE — ED Triage Notes (Signed)
Patient presents to Emergency Department via [facility] from HOME with complaints of fall.  Pt reports drinking "too much" [employer] and when pt left the porch to walk inside pt tripped on threshold and struck forehead just above left eye. Pronounced swelling noted and ice pack applied.  pt also c/o "knot" in the back of head pain to palpation.  Pt denies blood thinners, no evidence of skin break, no pain on palpation to bony prominences.   Pt reports fall occurred at 0200   History of GERD

## 2019-03-22 NOTE — ED Notes (Signed)
Pt weaning off of oxygen trial 2l via [place]. Pt sating at 98% on Ra at this time . This RN will continue to monitor pt.

## 2019-03-22 NOTE — ED Notes (Signed)
Patient transported to CT 

## 2019-03-22 NOTE — ED Notes (Signed)
Pt on the phone speaking to granddaughter confirming her ride home.

## 2019-03-22 NOTE — ED Provider Notes (Signed)
[facility] Emergency Department Provider Note   ____________________________________________   First MD Initiated Contact with Patient 03/22/19 0535     (approximate)  I have reviewed the triage vital signs and the nursing notes.   HISTORY  Chief Complaint Fall and Alcohol Intoxication    HPI Morgan Mcpherson is a 73 y.o. female brought to the ED from home via EMS status post mechanical fall.  Patient admits to drinking too much black velvet (liquor) and when she walked in door she tripped on the door frame and struck her left forehead above her left eye.  Called 911 for swelling to her left eyelid.  Denies use of anticoagulants.  Denies LOC.  Denies vision changes, neck pain, chest pain, shortness of breath, abdominal pain, nausea or vomiting.       Past Medical History:  Diagnosis Date   Cellulitis, leg    GERD (gastroesophageal reflux disease)    Hypertension     Patient Active Problem List   Diagnosis Date Noted   Essential hypertension, benign 10/17/2018   Swelling of limb 10/17/2018   Lymphedema 10/17/2018   Benign neoplasm of descending colon    Diverticulosis of large intestine without diverticulitis    Benign neoplasm of cecum    Stomach irritation    Hypokalemia 07/25/2018   Chronic leukopenia 07/11/2016   Thrombocytopenia ([place]) 07/11/2016   Anemia 07/11/2016   MGUS (monoclonal gammopathy of unknown significance) 06/27/2016   Other pancytopenia ([place]) 06/27/2016   Rectal discomfort 06/27/2016   Preventive measure 06/27/2016    Past Surgical History:  Procedure Laterality Date   COLONOSCOPY WITH PROPOFOL N/A 07/28/2018   Procedure: COLONOSCOPY WITH PROPOFOL;  Surgeon: Virgel Manifold, MD;  Location: [facility] ENDOSCOPY;  Service: Endoscopy;  Laterality: N/A;   ESOPHAGOGASTRODUODENOSCOPY (EGD) WITH PROPOFOL N/A 07/28/2018   Procedure: ESOPHAGOGASTRODUODENOSCOPY (EGD) WITH PROPOFOL;  Surgeon: Virgel Manifold,  MD;  Location: [facility] ENDOSCOPY;  Service: Endoscopy;  Laterality: N/A;    Prior to Admission medications   Medication Sig Start Date End Date Taking? Authorizing Provider  calcium acetate (PHOSLO) 667 MG capsule Take 1 capsule (667 mg total) by mouth 3 (three) times daily with meals. 07/28/18   Salary, Avel Peace, MD  NEOMYCIN-POLYMYXIN-HYDROCORTISONE (CORTISPORIN) 1 % SOLN otic solution Place 1 drop into the left ear 4 (four) times daily. 02/23/15   [provider]  pantoprazole (PROTONIX) 40 MG tablet Take 1 tablet (40 mg total) by mouth daily. 07/28/18 07/28/19  Salary, Avel Peace, MD  potassium chloride (K-DUR) 10 MEQ tablet Take 1 tablet (10 mEq total) by mouth daily. 07/28/18   Salary, Avel Peace, MD  tizanidine (ZANAFLEX) 2 MG capsule Take 2 mg by mouth 3 (three) times daily.    [provider]    Allergies Patient has no known allergies.  Family History  Problem Relation Age of Onset   Cancer Mother        unknown primary    Social History Social History   Tobacco Use   Smoking status: Never Smoker   Smokeless tobacco: Never Used  Substance Use Topics   Alcohol use: Yes    Comment: fell from drinking [employer]   Drug use: No    Review of Systems  Constitutional: No fever/chills Eyes: Positive for left-sided swelling.  No visual changes. ENT: No sore throat. Cardiovascular: Denies chest pain. Respiratory: Denies shortness of breath. Gastrointestinal: No abdominal pain.  No nausea, no vomiting.  No diarrhea.  No constipation. Genitourinary: Negative for dysuria.  Musculoskeletal: Negative for back pain. Skin: Negative for rash. Neurological: Negative for headaches, focal weakness or numbness.   ____________________________________________   PHYSICAL EXAM:  VITAL SIGNS: ED Triage Vitals  Enc Vitals Group     BP      Pulse      Resp      Temp      Temp src      SpO2      Weight      Height      Head Circumference      Peak Flow        Pain Score      Pain Loc      Pain Edu?      Excl. in [place]?     Constitutional: Alert and oriented. Well appearing and in no acute distress. Eyes: Left periorbital hematoma.  I am able to lift patient's eyelid to examine her pupil.  No hyphema.  Conjunctivae are normal. PERRL. EOMI. Head: Small occipital hematoma noted without laceration, abrasion or bleeding. Nose: Atraumatic. Mouth/Throat: Mucous membranes are moist.  No dental malocclusion. Neck: No stridor.  No cervical spine tenderness to palpation. Cardiovascular: Normal rate, regular rhythm. Grossly normal heart sounds.  Good peripheral circulation. Respiratory: Normal respiratory effort.  No retractions. Lungs CTAB. Gastrointestinal: Soft and nontender to light or deep palpation. No distention. No abdominal bruits. No CVA tenderness. Musculoskeletal: No lower extremity tenderness nor edema.  No joint effusions. Neurologic: Alert and oriented x3.  CN II-XII grossly intact.  Normal speech and language. No gross focal neurologic deficits are appreciated. MAEx4. Skin:  Skin is warm, dry and intact. No rash noted. Psychiatric: Mood and affect are normal. Speech and behavior are normal.  ____________________________________________   LABS (all labs ordered are listed, but only abnormal results are displayed)  Labs Reviewed  CBC WITH DIFFERENTIAL/PLATELET  COMPREHENSIVE METABOLIC PANEL  ETHANOL  LIPASE, BLOOD   ____________________________________________  EKG  None ____________________________________________  RADIOLOGY  ED MD interpretation: No ICH, no cervical spine injury, no facial fracture, no retrobulbar hematoma, left molar partial displacement chronicity uncertain  Official radiology report(s): Ct Head Wo Contrast  Result Date: 03/22/2019 CLINICAL DATA:  Fall, injury to LEFT forehead. EXAM: CT HEAD WITHOUT CONTRAST CT MAXILLOFACIAL WITHOUT CONTRAST CT CERVICAL SPINE WITHOUT CONTRAST TECHNIQUE: Multidetector  CT imaging of the head, cervical spine, and maxillofacial structures were performed using the standard protocol without intravenous contrast. Multiplanar CT image reconstructions of the cervical spine and maxillofacial structures were also generated. COMPARISON:  None. FINDINGS: CT HEAD FINDINGS Brain: Mild generalized age related parenchymal atrophy with commensurate dilatation of the ventricles and sulci. Mild chronic small vessel ischemic changes within the deep periventricular white matter regions bilaterally. No mass, hemorrhage, edema or other evidence of acute parenchymal abnormality. No extra-axial hemorrhage. Vascular: Chronic calcified atherosclerotic changes of the large vessels at the skull base. No unexpected hyperdense vessel. Skull: Normal. Negative for fracture or focal lesion. Other: Scalp edema/hematoma overlying the LEFT lower frontal bone. No underlying fracture. CT MAXILLOFACIAL FINDINGS Osseous: Lower frontal bones are intact. No displaced nasal bone fracture. Bilateral zygomatic arches and pterygoid plates are intact. Walls of the maxillary sinuses are intact bilaterally. No mandible fracture or displacement seen. Partial displacement/dislodgement of the most posterior LEFT mandibular molar, of uncertain chronicity. Dystrophic calcification and/or cement about the most posterior RIGHT molar, of uncertain etiology. Orbits: Orbital globes appear intact and symmetric in configuration. No retro-orbital hemorrhage or edema. Sinuses: Clear. Soft tissues: Soft tissue edema/hematoma overlying the LEFT lower  frontal bone and LEFT orbit. No underlying fracture. CT CERVICAL SPINE FINDINGS Alignment: Slight reversal of the normal cervical spine lordosis is likely related to patient positioning and/or muscle spasm. No evidence of acute vertebral body subluxation. Skull base and vertebrae: No fracture line or displaced fracture fragment. Facets appear intact and normally aligned throughout. Soft tissues and  spinal canal: No prevertebral fluid or swelling. No visible canal hematoma. Disc levels: Mild degenerative spondylosis at the C3-4 through C6-7 levels. No significant central canal stenosis at any level. Upper chest: Negative. Other: Bilateral carotid atherosclerosis. IMPRESSION: 1. Soft tissue edema/hematoma overlying the LEFT lower frontal bone and LEFT orbit. No underlying fracture. 2. No acute intracranial abnormality. No intracranial hemorrhage or edema. No skull fracture. 3. No facial bone fracture or dislocation. Partial displacement/dislodgement of the most posterior LEFT mandibular molar, of uncertain chronicity. Dystrophic calcification and/or cement about the most posterior RIGHT molar, of uncertain significance. 4. No fracture or acute subluxation within the cervical spine. Mild degenerative change, as detailed above. 5. Carotid atherosclerosis. Electronically Signed   By: Franki Cabot M.D.   On: 03/22/2019 06:13   Ct Cervical Spine Wo Contrast  Result Date: 03/22/2019 CLINICAL DATA:  Fall, injury to LEFT forehead. EXAM: CT HEAD WITHOUT CONTRAST CT MAXILLOFACIAL WITHOUT CONTRAST CT CERVICAL SPINE WITHOUT CONTRAST TECHNIQUE: Multidetector CT imaging of the head, cervical spine, and maxillofacial structures were performed using the standard protocol without intravenous contrast. Multiplanar CT image reconstructions of the cervical spine and maxillofacial structures were also generated. COMPARISON:  None. FINDINGS: CT HEAD FINDINGS Brain: Mild generalized age related parenchymal atrophy with commensurate dilatation of the ventricles and sulci. Mild chronic small vessel ischemic changes within the deep periventricular white matter regions bilaterally. No mass, hemorrhage, edema or other evidence of acute parenchymal abnormality. No extra-axial hemorrhage. Vascular: Chronic calcified atherosclerotic changes of the large vessels at the skull base. No unexpected hyperdense vessel. Skull: Normal. Negative  for fracture or focal lesion. Other: Scalp edema/hematoma overlying the LEFT lower frontal bone. No underlying fracture. CT MAXILLOFACIAL FINDINGS Osseous: Lower frontal bones are intact. No displaced nasal bone fracture. Bilateral zygomatic arches and pterygoid plates are intact. Walls of the maxillary sinuses are intact bilaterally. No mandible fracture or displacement seen. Partial displacement/dislodgement of the most posterior LEFT mandibular molar, of uncertain chronicity. Dystrophic calcification and/or cement about the most posterior RIGHT molar, of uncertain etiology. Orbits: Orbital globes appear intact and symmetric in configuration. No retro-orbital hemorrhage or edema. Sinuses: Clear. Soft tissues: Soft tissue edema/hematoma overlying the LEFT lower frontal bone and LEFT orbit. No underlying fracture. CT CERVICAL SPINE FINDINGS Alignment: Slight reversal of the normal cervical spine lordosis is likely related to patient positioning and/or muscle spasm. No evidence of acute vertebral body subluxation. Skull base and vertebrae: No fracture line or displaced fracture fragment. Facets appear intact and normally aligned throughout. Soft tissues and spinal canal: No prevertebral fluid or swelling. No visible canal hematoma. Disc levels: Mild degenerative spondylosis at the C3-4 through C6-7 levels. No significant central canal stenosis at any level. Upper chest: Negative. Other: Bilateral carotid atherosclerosis. IMPRESSION: 1. Soft tissue edema/hematoma overlying the LEFT lower frontal bone and LEFT orbit. No underlying fracture. 2. No acute intracranial abnormality. No intracranial hemorrhage or edema. No skull fracture. 3. No facial bone fracture or dislocation. Partial displacement/dislodgement of the most posterior LEFT mandibular molar, of uncertain chronicity. Dystrophic calcification and/or cement about the most posterior RIGHT molar, of uncertain significance. 4. No fracture or acute subluxation  within the cervical  spine. Mild degenerative change, as detailed above. 5. Carotid atherosclerosis. Electronically Signed   By: Franki Cabot M.D.   On: 03/22/2019 06:13   Ct Maxillofacial Wo Cm  Result Date: 03/22/2019 CLINICAL DATA:  Fall, injury to LEFT forehead. EXAM: CT HEAD WITHOUT CONTRAST CT MAXILLOFACIAL WITHOUT CONTRAST CT CERVICAL SPINE WITHOUT CONTRAST TECHNIQUE: Multidetector CT imaging of the head, cervical spine, and maxillofacial structures were performed using the standard protocol without intravenous contrast. Multiplanar CT image reconstructions of the cervical spine and maxillofacial structures were also generated. COMPARISON:  None. FINDINGS: CT HEAD FINDINGS Brain: Mild generalized age related parenchymal atrophy with commensurate dilatation of the ventricles and sulci. Mild chronic small vessel ischemic changes within the deep periventricular white matter regions bilaterally. No mass, hemorrhage, edema or other evidence of acute parenchymal abnormality. No extra-axial hemorrhage. Vascular: Chronic calcified atherosclerotic changes of the large vessels at the skull base. No unexpected hyperdense vessel. Skull: Normal. Negative for fracture or focal lesion. Other: Scalp edema/hematoma overlying the LEFT lower frontal bone. No underlying fracture. CT MAXILLOFACIAL FINDINGS Osseous: Lower frontal bones are intact. No displaced nasal bone fracture. Bilateral zygomatic arches and pterygoid plates are intact. Walls of the maxillary sinuses are intact bilaterally. No mandible fracture or displacement seen. Partial displacement/dislodgement of the most posterior LEFT mandibular molar, of uncertain chronicity. Dystrophic calcification and/or cement about the most posterior RIGHT molar, of uncertain etiology. Orbits: Orbital globes appear intact and symmetric in configuration. No retro-orbital hemorrhage or edema. Sinuses: Clear. Soft tissues: Soft tissue edema/hematoma overlying the LEFT lower  frontal bone and LEFT orbit. No underlying fracture. CT CERVICAL SPINE FINDINGS Alignment: Slight reversal of the normal cervical spine lordosis is likely related to patient positioning and/or muscle spasm. No evidence of acute vertebral body subluxation. Skull base and vertebrae: No fracture line or displaced fracture fragment. Facets appear intact and normally aligned throughout. Soft tissues and spinal canal: No prevertebral fluid or swelling. No visible canal hematoma. Disc levels: Mild degenerative spondylosis at the C3-4 through C6-7 levels. No significant central canal stenosis at any level. Upper chest: Negative. Other: Bilateral carotid atherosclerosis. IMPRESSION: 1. Soft tissue edema/hematoma overlying the LEFT lower frontal bone and LEFT orbit. No underlying fracture. 2. No acute intracranial abnormality. No intracranial hemorrhage or edema. No skull fracture. 3. No facial bone fracture or dislocation. Partial displacement/dislodgement of the most posterior LEFT mandibular molar, of uncertain chronicity. Dystrophic calcification and/or cement about the most posterior RIGHT molar, of uncertain significance. 4. No fracture or acute subluxation within the cervical spine. Mild degenerative change, as detailed above. 5. Carotid atherosclerosis. Electronically Signed   By: Franki Cabot M.D.   On: 03/22/2019 06:13    ____________________________________________   PROCEDURES  Procedure(s) performed (including Critical Care):  Procedures   ____________________________________________   INITIAL IMPRESSION / ASSESSMENT AND PLAN / ED COURSE  As part of my medical decision making, I reviewed the following data within the [place] notes reviewed and incorporated, Labs reviewed, Old chart reviewed, Radiograph reviewed and Notes from prior ED visits     Morgan Mcpherson was evaluated in Emergency Department on 03/22/2019 for the symptoms described in the history of present  illness. She was evaluated in the context of the global COVID-19 pandemic, which necessitated consideration that the patient might be at risk for infection with the SARS-CoV-2 virus that causes COVID-19. Institutional protocols and algorithms that pertain to the evaluation of patients at risk for COVID-19 are in a state of rapid change based on information  released by regulatory bodies including the CDC and federal and state organizations. These policies and algorithms were followed during the patient's care in the ED.   73 year old female who presents status post mechanical fall; EtOH involved.  Differential diagnosis includes but is not limited to [place], orbital fracture, cervical spine fracture/dislocation, contusion, etc.  Will check lab work, obtain CT head/cervical spine/maxillofacial.  Ice applied to area.  IV fluid resuscitation initiated.   Clinical Course as of Mar 21 704  Sun Mar 22, 2019  0620 Updated patient on CT results.  Left lower molar is not loose nor is it hurting the patient.  IV fluids infusing; awaiting lab results.  Anticipate discharge home once patient is sober and ambulatory with steady gait.   [JS]  0703 Care transferred to Dr. Cherylann Banas pending lab results. Anticipate discharge home once patient is sober and ambulatory with steady gait.   [JS]    Clinical Course User Index [JS] Paulette Blanch, MD     ____________________________________________   FINAL CLINICAL IMPRESSION(S) / ED DIAGNOSES  Final diagnoses:  Fall, initial encounter  Alcoholic intoxication without complication ([place])  Periorbital hematoma of left eye     ED Discharge Orders    None       Note:  This document was prepared using Dragon voice recognition software and may include unintentional dictation errors.   Paulette Blanch, MD 03/22/19 [phone]

## 2019-03-22 NOTE — ED Notes (Signed)
Pt provided with water and scrub pants and pt's belonging placed in belonging bag and given to pt by this RN.

## 2019-03-22 NOTE — ED Notes (Signed)
Pt using bedpan at this time and provide with a phone to contact her POC

## 2019-03-22 NOTE — Discharge Instructions (Signed)
Apply ice to affected area several times daily to reduce swelling.  You may take Tylenol as needed for pain.  Return to the ER for worsening symptoms, persistent vomiting, lethargy or other concerns.

## 2019-03-23 ENCOUNTER — Other Ambulatory Visit: Payer: Self-pay | Admitting: Family Medicine

## 2019-03-23 DIAGNOSIS — Z780 Asymptomatic menopausal state: Secondary | ICD-10-CM

## 2019-03-23 DIAGNOSIS — Z1231 Encounter for screening mammogram for malignant neoplasm of breast: Secondary | ICD-10-CM

## 2019-05-12 ENCOUNTER — Ambulatory Visit (INDEPENDENT_AMBULATORY_CARE_PROVIDER_SITE_OTHER): Payer: Medicare Other | Admitting: Vascular Surgery

## 2019-05-29 ENCOUNTER — Ambulatory Visit (INDEPENDENT_AMBULATORY_CARE_PROVIDER_SITE_OTHER): Payer: Medicare Other | Admitting: Nurse Practitioner

## 2019-10-18 ENCOUNTER — Inpatient Hospital Stay: Payer: Medicare Other

## 2019-10-18 ENCOUNTER — Emergency Department: Payer: Medicare Other

## 2019-10-18 ENCOUNTER — Other Ambulatory Visit: Payer: Self-pay

## 2019-10-18 ENCOUNTER — Inpatient Hospital Stay
Admission: EM | Admit: 2019-10-18 | Discharge: 2019-11-02 | DRG: 871 | Disposition: E | Payer: Medicare Other | Attending: Internal Medicine | Admitting: Internal Medicine

## 2019-10-18 DIAGNOSIS — R571 Hypovolemic shock: Secondary | ICD-10-CM | POA: Diagnosis present

## 2019-10-18 DIAGNOSIS — K559 Vascular disorder of intestine, unspecified: Secondary | ICD-10-CM | POA: Diagnosis not present

## 2019-10-18 DIAGNOSIS — R569 Unspecified convulsions: Secondary | ICD-10-CM

## 2019-10-18 DIAGNOSIS — K7030 Alcoholic cirrhosis of liver without ascites: Secondary | ICD-10-CM | POA: Diagnosis present

## 2019-10-18 DIAGNOSIS — N170 Acute kidney failure with tubular necrosis: Secondary | ICD-10-CM | POA: Diagnosis present

## 2019-10-18 DIAGNOSIS — R7989 Other specified abnormal findings of blood chemistry: Secondary | ICD-10-CM | POA: Insufficient documentation

## 2019-10-18 DIAGNOSIS — I5022 Chronic systolic (congestive) heart failure: Secondary | ICD-10-CM | POA: Diagnosis present

## 2019-10-18 DIAGNOSIS — Z809 Family history of malignant neoplasm, unspecified: Secondary | ICD-10-CM

## 2019-10-18 DIAGNOSIS — D472 Monoclonal gammopathy: Secondary | ICD-10-CM | POA: Diagnosis present

## 2019-10-18 DIAGNOSIS — E876 Hypokalemia: Secondary | ICD-10-CM | POA: Diagnosis present

## 2019-10-18 DIAGNOSIS — R778 Other specified abnormalities of plasma proteins: Secondary | ICD-10-CM | POA: Diagnosis not present

## 2019-10-18 DIAGNOSIS — Z20822 Contact with and (suspected) exposure to covid-19: Secondary | ICD-10-CM | POA: Diagnosis present

## 2019-10-18 DIAGNOSIS — K5730 Diverticulosis of large intestine without perforation or abscess without bleeding: Secondary | ICD-10-CM | POA: Diagnosis present

## 2019-10-18 DIAGNOSIS — R531 Weakness: Secondary | ICD-10-CM | POA: Diagnosis not present

## 2019-10-18 DIAGNOSIS — K219 Gastro-esophageal reflux disease without esophagitis: Secondary | ICD-10-CM | POA: Diagnosis present

## 2019-10-18 DIAGNOSIS — R1013 Epigastric pain: Secondary | ICD-10-CM

## 2019-10-18 DIAGNOSIS — E860 Dehydration: Secondary | ICD-10-CM

## 2019-10-18 DIAGNOSIS — Z4659 Encounter for fitting and adjustment of other gastrointestinal appliance and device: Secondary | ICD-10-CM

## 2019-10-18 DIAGNOSIS — I130 Hypertensive heart and chronic kidney disease with heart failure and stage 1 through stage 4 chronic kidney disease, or unspecified chronic kidney disease: Secondary | ICD-10-CM | POA: Diagnosis present

## 2019-10-18 DIAGNOSIS — D61818 Other pancytopenia: Secondary | ICD-10-CM | POA: Diagnosis present

## 2019-10-18 DIAGNOSIS — K859 Acute pancreatitis without necrosis or infection, unspecified: Secondary | ICD-10-CM | POA: Diagnosis present

## 2019-10-18 DIAGNOSIS — A419 Sepsis, unspecified organism: Secondary | ICD-10-CM | POA: Diagnosis present

## 2019-10-18 DIAGNOSIS — E872 Acidosis: Secondary | ICD-10-CM | POA: Diagnosis present

## 2019-10-18 DIAGNOSIS — R4182 Altered mental status, unspecified: Secondary | ICD-10-CM | POA: Diagnosis present

## 2019-10-18 DIAGNOSIS — I248 Other forms of acute ischemic heart disease: Secondary | ICD-10-CM | POA: Diagnosis present

## 2019-10-18 DIAGNOSIS — I469 Cardiac arrest, cause unspecified: Secondary | ICD-10-CM

## 2019-10-18 DIAGNOSIS — K760 Fatty (change of) liver, not elsewhere classified: Secondary | ICD-10-CM | POA: Diagnosis present

## 2019-10-18 DIAGNOSIS — R109 Unspecified abdominal pain: Secondary | ICD-10-CM

## 2019-10-18 DIAGNOSIS — R570 Cardiogenic shock: Secondary | ICD-10-CM | POA: Diagnosis present

## 2019-10-18 DIAGNOSIS — R6521 Severe sepsis with septic shock: Secondary | ICD-10-CM | POA: Diagnosis present

## 2019-10-18 DIAGNOSIS — J9602 Acute respiratory failure with hypercapnia: Secondary | ICD-10-CM | POA: Diagnosis present

## 2019-10-18 DIAGNOSIS — N183 Chronic kidney disease, stage 3 unspecified: Secondary | ICD-10-CM | POA: Diagnosis present

## 2019-10-18 DIAGNOSIS — J9601 Acute respiratory failure with hypoxia: Secondary | ICD-10-CM

## 2019-10-18 DIAGNOSIS — I4891 Unspecified atrial fibrillation: Secondary | ICD-10-CM | POA: Diagnosis present

## 2019-10-18 HISTORY — DX: Unspecified cirrhosis of liver: K74.60

## 2019-10-18 LAB — URINALYSIS, COMPLETE (UACMP) WITH MICROSCOPIC
Glucose, UA: NEGATIVE mg/dL
Hgb urine dipstick: NEGATIVE
Ketones, ur: 5 mg/dL — AB
Leukocytes,Ua: NEGATIVE
Nitrite: NEGATIVE
Protein, ur: 100 mg/dL — AB
Specific Gravity, Urine: 1.023 (ref 1.005–1.030)
pH: 5 (ref 5.0–8.0)

## 2019-10-18 LAB — LIPASE, BLOOD: Lipase: 40 U/L (ref 11–51)

## 2019-10-18 LAB — COMPREHENSIVE METABOLIC PANEL
ALT: 16 U/L (ref 0–44)
AST: 58 U/L — ABNORMAL HIGH (ref 15–41)
Albumin: 4.1 g/dL (ref 3.5–5.0)
Alkaline Phosphatase: 108 U/L (ref 38–126)
Anion gap: 20 — ABNORMAL HIGH (ref 5–15)
BUN: 11 mg/dL (ref 8–23)
CO2: 22 mmol/L (ref 22–32)
Calcium: 9.5 mg/dL (ref 8.9–10.3)
Chloride: 102 mmol/L (ref 98–111)
Creatinine, Ser: 1.17 mg/dL — ABNORMAL HIGH (ref 0.44–1.00)
GFR calc Af Amer: 54 mL/min — ABNORMAL LOW (ref >60)
GFR calc non Af Amer: 47 mL/min — ABNORMAL LOW (ref >60)
Glucose, Bld: 104 mg/dL — ABNORMAL HIGH (ref 70–99)
Potassium: 2.6 mmol/L — CL (ref 3.5–5.1)
Sodium: 144 mmol/L (ref 135–145)
Total Bilirubin: 2.2 mg/dL — ABNORMAL HIGH (ref 0.3–1.2)
Total Protein: 8.8 g/dL — ABNORMAL HIGH (ref 6.5–8.1)

## 2019-10-18 LAB — CBC WITH DIFFERENTIAL/PLATELET
Abs Immature Granulocytes: 0.04 10*3/uL (ref 0.00–0.07)
Basophils Absolute: 0 10*3/uL (ref 0.0–0.1)
Basophils Relative: 0 %
Eosinophils Absolute: 0 10*3/uL (ref 0.0–0.5)
Eosinophils Relative: 0 %
HCT: 33.2 % — ABNORMAL LOW (ref 36.0–46.0)
Hemoglobin: 10.9 g/dL — ABNORMAL LOW (ref 12.0–15.0)
Immature Granulocytes: 1 %
Lymphocytes Relative: 19 %
Lymphs Abs: 0.9 10*3/uL (ref 0.7–4.0)
MCH: 31.1 pg (ref 26.0–34.0)
MCHC: 32.8 g/dL (ref 30.0–36.0)
MCV: 94.9 fL (ref 80.0–100.0)
Monocytes Absolute: 0.2 10*3/uL (ref 0.1–1.0)
Monocytes Relative: 4 %
Neutro Abs: 3.5 10*3/uL (ref 1.7–7.7)
Neutrophils Relative %: 76 %
Platelets: 80 10*3/uL — ABNORMAL LOW (ref 150–400)
RBC: 3.5 MIL/uL — ABNORMAL LOW (ref 3.87–5.11)
RDW: 17.5 % — ABNORMAL HIGH (ref 11.5–15.5)
WBC: 4.6 10*3/uL (ref 4.0–10.5)
nRBC: 0 % (ref 0.0–0.2)

## 2019-10-18 LAB — URINE DRUG SCREEN, QUALITATIVE (ARMC ONLY)
Amphetamines, Ur Screen: NOT DETECTED
Barbiturates, Ur Screen: NOT DETECTED
Benzodiazepine, Ur Scrn: NOT DETECTED
Cannabinoid 50 Ng, Ur ~~LOC~~: NOT DETECTED
Cocaine Metabolite,Ur ~~LOC~~: NOT DETECTED
MDMA (Ecstasy)Ur Screen: NOT DETECTED
Methadone Scn, Ur: NOT DETECTED
Opiate, Ur Screen: NOT DETECTED
Phencyclidine (PCP) Ur S: NOT DETECTED
Tricyclic, Ur Screen: NOT DETECTED

## 2019-10-18 LAB — TROPONIN I (HIGH SENSITIVITY)
Troponin I (High Sensitivity): 94 ng/L — ABNORMAL HIGH (ref <18)
Troponin I (High Sensitivity): 79 ng/L — ABNORMAL HIGH (ref <18)

## 2019-10-18 LAB — LACTIC ACID, PLASMA
Lactic Acid, Venous: 7.4 mmol/L (ref 0.5–1.9)
Lactic Acid, Venous: 6.6 mmol/L (ref 0.5–1.9)
Lactic Acid, Venous: 7.6 mmol/L (ref 0.5–1.9)
Lactic Acid, Venous: 8.1 mmol/L (ref 0.5–1.9)

## 2019-10-18 LAB — AMMONIA: Ammonia: 33 umol/L (ref 9–35)

## 2019-10-18 LAB — MAGNESIUM: Magnesium: 1.3 mg/dL — ABNORMAL LOW (ref 1.7–2.4)

## 2019-10-18 LAB — HEMOGLOBIN A1C
Hgb A1c MFr Bld: 5.9 % — ABNORMAL HIGH (ref 4.8–5.6)
Mean Plasma Glucose: 122.63 mg/dL

## 2019-10-18 LAB — APTT: aPTT: 40 seconds — ABNORMAL HIGH (ref 24–36)

## 2019-10-18 LAB — TSH: TSH: 3.879 u[IU]/mL (ref 0.350–4.500)

## 2019-10-18 LAB — PHOSPHORUS: Phosphorus: <1 mg/dL — CL (ref 2.5–4.6)

## 2019-10-18 LAB — SARS CORONAVIRUS 2 (TAT 6-24 HRS): SARS Coronavirus 2: NEGATIVE

## 2019-10-18 LAB — BRAIN NATRIURETIC PEPTIDE: B Natriuretic Peptide: 459 pg/mL — ABNORMAL HIGH (ref 0.0–100.0)

## 2019-10-18 LAB — CK: Total CK: 34 U/L — ABNORMAL LOW (ref 38–234)

## 2019-10-18 LAB — CORTISOL: Cortisol, Plasma: 49.3 ug/dL

## 2019-10-18 LAB — PROCALCITONIN: Procalcitonin: 2.31 ng/mL

## 2019-10-18 MED ORDER — SODIUM CHLORIDE 0.9 % IV BOLUS
1000.0000 mL | Freq: Once | INTRAVENOUS | Status: AC
  Administered 2019-10-18: 1000 mL via INTRAVENOUS

## 2019-10-18 MED ORDER — POTASSIUM CHLORIDE 10 MEQ/100ML IV SOLN
10.0000 meq | INTRAVENOUS | Status: AC
  Administered 2019-10-18 (×4): 10 meq via INTRAVENOUS
  Filled 2019-10-18 (×5): qty 100

## 2019-10-18 MED ORDER — ENOXAPARIN SODIUM 40 MG/0.4ML ~~LOC~~ SOLN: 40.0000 mg | SUBCUTANEOUS | Status: DC

## 2019-10-18 MED ORDER — LACTATED RINGERS IV BOLUS
2000.0000 mL | Freq: Once | INTRAVENOUS | Status: AC
  Administered 2019-10-18: 2000 mL via INTRAVENOUS

## 2019-10-18 MED ORDER — CIPROFLOXACIN IN D5W 400 MG/200ML IV SOLN
400.0000 mg | Freq: Two times a day (BID) | INTRAVENOUS | Status: DC
  Administered 2019-10-19: 400 mg via INTRAVENOUS
  Filled 2019-10-18 (×3): qty 200

## 2019-10-18 MED ORDER — MAGNESIUM SULFATE 4 GM/100ML IV SOLN
4.0000 g | Freq: Once | INTRAVENOUS | Status: AC
  Administered 2019-10-18: 4 g via INTRAVENOUS
  Filled 2019-10-18: qty 100

## 2019-10-18 MED ORDER — ONDANSETRON HCL 4 MG/2ML IJ SOLN: 4.0000 mg | Freq: Four times a day (QID) | INTRAMUSCULAR | Status: DC | PRN

## 2019-10-18 MED ORDER — METOPROLOL TARTRATE 5 MG/5ML IV SOLN
INTRAVENOUS | Status: AC
  Filled 2019-10-18: qty 5

## 2019-10-18 MED ORDER — ACETAMINOPHEN 325 MG PO TABS: 650.0000 mg | ORAL_TABLET | Freq: Four times a day (QID) | ORAL | Status: DC | PRN

## 2019-10-18 MED ORDER — ONDANSETRON HCL 4 MG PO TABS: 4.0000 mg | ORAL_TABLET | Freq: Four times a day (QID) | ORAL | Status: DC | PRN

## 2019-10-18 MED ORDER — SODIUM CHLORIDE 0.9 % IV BOLUS: 1000.0000 mL | Freq: Once | INTRAVENOUS | Status: DC

## 2019-10-18 MED ORDER — PIPERACILLIN-TAZOBACTAM 3.375 G IVPB
3.3750 g | Freq: Three times a day (TID) | INTRAVENOUS | Status: DC
  Administered 2019-10-18: 3.375 g via INTRAVENOUS
  Filled 2019-10-18: qty 50

## 2019-10-18 MED ORDER — SODIUM CHLORIDE 0.9 % IV BOLUS
1000.0000 mL | Freq: Once | INTRAVENOUS | Status: AC
  Administered 2019-10-18: 17:00:00 1000 mL via INTRAVENOUS

## 2019-10-18 MED ORDER — SODIUM PHOSPHATES 45 MMOLE/15ML IV SOLN
30.0000 mmol | Freq: Once | INTRAVENOUS | Status: AC
  Administered 2019-10-18: 30 mmol via INTRAVENOUS
  Filled 2019-10-18: qty 10

## 2019-10-18 MED ORDER — HEPARIN (PORCINE) 25000 UT/250ML-% IV SOLN
INTRAVENOUS | Status: AC
  Filled 2019-10-18: qty 250

## 2019-10-18 MED ORDER — NOREPINEPHRINE 4 MG/250ML-% IV SOLN
0.0000 ug/min | INTRAVENOUS | Status: DC
  Administered 2019-10-18: 10 ug/min via INTRAVENOUS
  Administered 2019-10-19 (×2): 16 ug/min via INTRAVENOUS
  Administered 2019-10-19: 18 ug/min via INTRAVENOUS
  Filled 2019-10-18 (×4): qty 250

## 2019-10-18 MED ORDER — HEPARIN BOLUS VIA INFUSION
4000.0000 [IU] | Freq: Once | INTRAVENOUS | Status: AC
  Administered 2019-10-18: 4000 [IU] via INTRAVENOUS
  Filled 2019-10-18: qty 4000

## 2019-10-18 MED ORDER — POTASSIUM CHLORIDE 10 MEQ/100ML IV SOLN
10.0000 meq | INTRAVENOUS | Status: AC
  Administered 2019-10-18: 15:00:00 10 meq via INTRAVENOUS
  Filled 2019-10-18 (×2): qty 100

## 2019-10-18 MED ORDER — HEPARIN SODIUM (PORCINE) 5000 UNIT/ML IJ SOLN
5000.0000 [IU] | Freq: Three times a day (TID) | INTRAMUSCULAR | Status: DC
  Administered 2019-10-19: 5000 [IU] via SUBCUTANEOUS
  Filled 2019-10-18: qty 1

## 2019-10-18 MED ORDER — SODIUM CHLORIDE 0.9% FLUSH
3.0000 mL | Freq: Two times a day (BID) | INTRAVENOUS | Status: DC
  Administered 2019-10-19: 3 mL via INTRAVENOUS

## 2019-10-18 MED ORDER — LACTATED RINGERS IV SOLN: INTRAVENOUS | Status: DC

## 2019-10-18 MED ORDER — HEPARIN (PORCINE) 25000 UT/250ML-% IV SOLN
1000.0000 [IU]/h | INTRAVENOUS | Status: DC
  Administered 2019-10-18: 1000 [IU]/h via INTRAVENOUS

## 2019-10-18 MED ORDER — ASPIRIN EC 81 MG PO TBEC
81.0000 mg | DELAYED_RELEASE_TABLET | Freq: Every day | ORAL | Status: DC
  Filled 2019-10-18: qty 1

## 2019-10-18 MED ORDER — SENNA 8.6 MG PO TABS: 1.0000 | ORAL_TABLET | Freq: Two times a day (BID) | ORAL | Status: DC

## 2019-10-18 MED ORDER — PANTOPRAZOLE SODIUM 40 MG PO TBEC
40.0000 mg | DELAYED_RELEASE_TABLET | Freq: Every day | ORAL | Status: DC
  Filled 2019-10-18: qty 1

## 2019-10-18 MED ORDER — AMIODARONE HCL IN DEXTROSE 360-4.14 MG/200ML-% IV SOLN
INTRAVENOUS | Status: AC
  Filled 2019-10-18: qty 200

## 2019-10-18 MED ORDER — POTASSIUM CHLORIDE CRYS ER 20 MEQ PO TBCR
40.0000 meq | EXTENDED_RELEASE_TABLET | Freq: Once | ORAL | Status: AC
  Administered 2019-10-18: 19:00:00 40 meq via ORAL
  Filled 2019-10-18: qty 2

## 2019-10-18 MED ORDER — ACETAMINOPHEN 650 MG RE SUPP: 650.0000 mg | Freq: Four times a day (QID) | RECTAL | Status: DC | PRN

## 2019-10-18 MED ORDER — METRONIDAZOLE IN NACL 5-0.79 MG/ML-% IV SOLN
500.0000 mg | Freq: Three times a day (TID) | INTRAVENOUS | Status: DC
  Administered 2019-10-18: 500 mg via INTRAVENOUS
  Filled 2019-10-18 (×3): qty 100

## 2019-10-18 MED ORDER — AMIODARONE IV BOLUS ONLY 150 MG/100ML
INTRAVENOUS | Status: AC
  Filled 2019-10-18: qty 100

## 2019-10-18 MED ORDER — METOPROLOL TARTRATE 5 MG/5ML IV SOLN
5.0000 mg | INTRAVENOUS | Status: AC
  Administered 2019-10-18: 5 mg via INTRAVENOUS

## 2019-10-18 NOTE — ED Provider Notes (Signed)
#  1 Angiocath insertion Performed by: Lavonia Drafts  Consent: Verbal consent obtained. Risks and benefits: risks, benefits and alternatives were discussed Time out: Immediately prior to procedure a "time out" was called to verify the correct patient, procedure, equipment, support staff and site/side marked as required.  Preparation: Patient was prepped and draped in the usual sterile fashion.  Vein Location: right brachial  Ultrasound Guided  Gauge: 18  Normal blood return and flush without difficulty Patient tolerance: Patient tolerated the procedure well with no immediate complications.   #2 Angiocath insertion Performed by: Lavonia Drafts  Consent: Verbal consent obtained. Risks and benefits: risks, benefits and alternatives were discussed Time out: Immediately prior to procedure a "time out" was called to verify the correct patient, procedure, equipment, support staff and site/side marked as required.  Preparation: Patient was prepped and draped in the usual sterile fashion.  Vein Location: right AC  Ultrasound Guided  Gauge: 18  Normal blood return and flush without difficulty Patient tolerance: Patient tolerated the procedure well with no immediate complications.     Lavonia Drafts, MD 10/09/2019 [phone]

## 2019-10-18 NOTE — ED Notes (Signed)
Morgan Blew, np at bedside.

## 2019-10-18 NOTE — ED Notes (Signed)
Notified brenda smith, np regarding pt's decline in status.

## 2019-10-18 NOTE — ED Notes (Signed)
Pt to ct with rn assist prior to central line placement.

## 2019-10-18 NOTE — ED Notes (Signed)
Central line insertion complete. All lines have positive blood return and flush easily. Lines switched to central line.

## 2019-10-18 NOTE — ED Notes (Signed)
Lab called for venipuncture assist for lactic acid.

## 2019-10-18 NOTE — ED Notes (Signed)
Pt placed on NRB.

## 2019-10-18 NOTE — Consult Note (Signed)
[facility] Consultation Note  Patient ID: Morgan Mcpherson, MRN: [id number], DOB/AGE: 72-Mar-1947 72 y.o. Admit date: 10/31/2019   Date of Consult: 10/08/2019 Primary Physician: Donnie Coffin, MD Primary Cardiologist: Nehemiah Massed  Chief Complaint:  Chief Complaint  Patient presents with  . Altered Mental Status   Reason for Consult: Elevated troponin  HPI: 74 y.o. female with known apparent previous systolic dysfunction congestive heart failure essential hypertension mixed hyperlipidemia chronic kidney disease stage III with abnormal EKG who has had a significant new onset of altered mental status.  The patient has been apparently weak and fatigued with this altered mental status.  When coming to the emergency room she had some brief episode of chest discomfort and shortness of breath but completely resolved spontaneously.  The patient since then has been hemodynamically stable with no evidence of concerns of congestive heart failure or anginal equivalent.  The patient does have a troponin level of 94 and a chest x-ray which is normal.  There is an EKG showing sinus tachycardia with inferior infarct age undetermined with poor R wave progression.  Currently the patient is hemodynamically stable  Past Medical History:  Diagnosis Date  . Cellulitis, leg   . GERD (gastroesophageal reflux disease)   . Hypertension       Surgical History:  Past Surgical History:  Procedure Laterality Date  . COLONOSCOPY WITH PROPOFOL N/A 07/28/2018   Procedure: COLONOSCOPY WITH PROPOFOL;  Surgeon: Virgel Manifold, MD;  Location: [facility] ENDOSCOPY;  Service: Endoscopy;  Laterality: N/A;  . ESOPHAGOGASTRODUODENOSCOPY (EGD) WITH PROPOFOL N/A 07/28/2018   Procedure: ESOPHAGOGASTRODUODENOSCOPY (EGD) WITH PROPOFOL;  Surgeon: Virgel Manifold, MD;  Location: [facility] ENDOSCOPY;  Service: Endoscopy;  Laterality: N/A;     Home Meds: Prior to Admission medications   Medication Sig Start Date End Date  Taking? Authorizing Provider  calcium acetate (PHOSLO) 667 MG capsule Take 1 capsule (667 mg total) by mouth 3 (three) times daily with meals. 07/28/18   Salary, Avel Peace, MD  NEOMYCIN-POLYMYXIN-HYDROCORTISONE (CORTISPORIN) 1 % SOLN otic solution Place 1 drop into the left ear 4 (four) times daily. 02/23/15   [provider]  pantoprazole (PROTONIX) 40 MG tablet Take 1 tablet (40 mg total) by mouth daily. 07/28/18 07/28/19  Salary, Avel Peace, MD  potassium chloride (K-DUR) 10 MEQ tablet Take 1 tablet (10 mEq total) by mouth daily. 07/28/18   Salary, Avel Peace, MD  tizanidine (ZANAFLEX) 2 MG capsule Take 2 mg by mouth 3 (three) times daily.    [provider]    Inpatient Medications:  . aspirin EC  81 mg Oral Daily  . enoxaparin (LOVENOX) injection  40 mg Subcutaneous Q24H  . pantoprazole  40 mg Oral Daily  . potassium chloride  40 mEq Oral Once  . senna  1 tablet Oral BID  . sodium chloride flush  3 mL Intravenous Q12H   . lactated ringers    . magnesium sulfate bolus IVPB    . potassium chloride 10 mEq (10/07/2019 1704)    Allergies: No Known Allergies  Social History   Socioeconomic History  . Marital status: Single    Spouse name: Not on file  . Number of children: 0  . Years of education: Not on file  . Highest education level: Not on file  Occupational History  . Occupation: Psychologist, clinical, retired  Tobacco Use  . Smoking status: Never Smoker  . Smokeless tobacco: Never Used  Substance and Sexual Activity  . Alcohol use: Yes  Comment: fell from drinking [employer]  . Drug use: No  . Sexual activity: Not Currently  Other Topics Concern  . Not on file  Social History Narrative  . Not on file   Social Determinants of Health   Financial Resource Strain:   . Difficulty of Paying Living Expenses: Not on file  Food Insecurity:   . Worried About Charity fundraiser in the Last Year: Not on file  . Ran Out of Food in the Last Year: Not on file   Transportation Needs:   . Lack of Transportation (Medical): Not on file  . Lack of Transportation (Non-Medical): Not on file  Physical Activity:   . Days of Exercise per Week: Not on file  . Minutes of Exercise per Session: Not on file  Stress:   . Feeling of Stress : Not on file  Social Connections:   . Frequency of Communication with Friends and Family: Not on file  . Frequency of Social Gatherings with Friends and Family: Not on file  . Attends Religious Services: Not on file  . Active Member of Clubs or Organizations: Not on file  . Attends Archivist Meetings: Not on file  . Marital Status: Not on file  Intimate Partner Violence:   . Fear of Current or Ex-Partner: Not on file  . Emotionally Abused: Not on file  . Physically Abused: Not on file  . Sexually Abused: Not on file     Family History  Problem Relation Age of Onset  . Cancer Mother        unknown primary     Review of Systems Patient does have altered mental status  labs: Recent Labs    10/05/2019 1240  CKTOTAL 34*   Lab Results  Component Value Date   WBC 4.6 10/10/2019   HGB 10.9 (L) 10/20/2019   HCT 33.2 (L) 10/09/2019   MCV 94.9 10/15/2019   PLT 80 (L) 10/24/2019    Recent Labs  Lab 10/25/2019 1241  NA 144  K 2.6*  CL 102  CO2 22  BUN 11  CREATININE 1.17*  CALCIUM 9.5  PROT 8.8*  BILITOT 2.2*  ALKPHOS 108  ALT 16  AST 58*  GLUCOSE 104*   No results found for: CHOL, HDL, LDLCALC, TRIG No results found for: DDIMER  Radiology/Studies:  DG Chest 1 View  Result Date: 11/01/2019 CLINICAL DATA:  Seizure-like activity. Not feeling well for 4 days weakness, headache, decreased appetite. EXAM: CHEST  1 VIEW COMPARISON:  Chest x-ray dated 07/25/2018. FINDINGS: Stable cardiomegaly. Lungs are clear. No pleural effusion or pneumothorax is seen. Osseous structures about the chest are unremarkable. IMPRESSION: No active disease. No evidence of pneumonia or pulmonary edema. Stable  cardiomegaly. Electronically Signed   By: Franki Cabot M.D.   On: 10/18/2019 13:53   CT Head Wo Contrast  Result Date: 10/18/2019 CLINICAL DATA:  Seizure-like activity. EXAM: CT HEAD WITHOUT CONTRAST TECHNIQUE: Contiguous axial images were obtained from the base of the skull through the vertex without intravenous contrast. COMPARISON:  March 22, 2019 FINDINGS: Brain: No evidence of acute infarction, hemorrhage, hydrocephalus, extra-axial collection or mass lesion/mass effect. There is chronic diffuse atrophy. Mild chronic bilateral periventricular white matter small vessel ischemic changes are noted. Vascular: No hyperdense vessels noted. Skull: Normal. Negative for fracture or focal lesion. Sinuses/Orbits: No acute finding. Other: None. IMPRESSION: No focal acute intracranial abnormality identified. Chronic diffuse atrophy. Mild chronic bilateral periventricular white matter small vessel ischemic change. Electronically Signed  By: Abelardo Diesel M.D.   On: 10/15/2019 13:38    EKG: Sinus tachycardia with inferior infarct age undetermined with poor R wave progression  Weights: [employer]   10/30/2019 1219  Weight: 74.8 kg     Physical Exam: Blood pressure (!) 104/59, pulse 82, temperature 97.8 F (36.6 C), temperature source Axillary, resp. rate (!) 24, height 5\' 2"  (1.575 m), weight 74.8 kg, SpO2 99 %. Body mass index is 30.18 kg/m. General: Poorly developed, not well nourished, in no acute distress. Head eyes ears nose throat: Normocephalic, atraumatic, sclera non-icteric, no xanthomas, nares are without discharge. No apparent thyromegaly and/or mass  Lungs: Normal respiratory effort.  no wheezes, no rales, no rhonchi.  Heart: RRR with normal S1 S2. no murmur gallop, no rub, PMI is normal size and placement, carotid upstroke normal without bruit, jugular venous pressure is normal Abdomen: Soft, non-tender, non-distended with normoactive bowel sounds. No hepatomegaly. No rebound/guarding. No  obvious abdominal masses. Abdominal aorta is normal size without bruit Extremities: Trace edema. no cyanosis, no clubbing, no ulcers  Peripheral : 2+ bilateral upper extremity pulses, 2+ bilateral femoral pulses, 2+ bilateral dorsal pedal pulse Neuro: Not alert and oriented. No facial asymmetry. No focal deficit. Moves all extremities spontaneously. Musculoskeletal: Normal muscle tone without kyphosis Psych: Does not responds to questions appropriately with a normal affect.    Assessment: 74 year old female with hypertension hyperlipidemia chronic kidney disease and apparent previous chronic systolic dysfunction heart failure on previously appropriate medication management having altered mental status and elevated troponin with brief episode of chest discomfort currently without angina and heart failure symptoms  Plan: 1.  Continue further evaluation and treatment of altered mental status and concerns of patient's weakness and fatigue 2.  Serial ECG and enzymes to assess for possible myocardial infarction 3.  Reinstatement of medication management for hypertension control and treatment of chronic systolic dysfunction congestive heart failure 4.  Further diagnostic testing including the possibility of echocardiogram which is not done in many years for further evaluation of extent of LV dysfunction and need for adjustments of medication management 5.  Further treatment options after patient receives above  Signed, Corey Skains M.D. [facility] 10/11/2019, 5:22 PM

## 2019-10-18 NOTE — ED Notes (Signed)
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

## 2019-10-18 NOTE — ED Provider Notes (Signed)
[facility] Emergency Department Provider Note  ____________________________________________  Time seen: Approximately 12:19 PM  I have reviewed the triage vital signs and the nursing notes.   HISTORY  Chief Complaint Altered Mental Status    HPI Morgan Mcpherson is a 74 y.o. female who presents the emergency department via EMS from home for complaint of altered mental status.  EMS was called to the residence for complaint of weakness and altered mental status.  On arrival, patient was alert and oriented x4, initially declined any medical problems or need for transport.  EMS was able to convince the patient to be transported for evaluation.  Once the patient was in the ambulance, patient had a witnessed seizure lasting 10 to 15 seconds with incontinence.  Patient did recover from seizure without medical intervention.  Since then, patient has been alert and oriented for EMS.  Just prior to onset of seizure, patient did complain of both a headache and chest pain.  Patient currently denies these complaints.  Vital signs had been stable with EMS.  Patient states that she has not been "feeling good" x4 days.  She states that she has felt weak, no appetite.  According to the family and the patient she has been sitting on the same piece of furniture x4 days.  She had not got up even to use the restroom.  The patient arrives with obviously soiled clothes, EMS also reports the patient was incontinent turn the 10 to 15 seconds of seizure-like activity.  Patient currently denies any headache, vision changes, neck pain, chest pain, shortness of breath.  She denies any recent nasal congestion, sore throat or cough.  She denies any abdominal pain.  She is slightly nauseated and does not have an appetite but no emesis.  She denies diarrhea or constipation.  Patient also denies any urinary complaints.   Patient has a history of hypertension, GERD, thrombocytopenia/pancytopenia.        Past  Medical History:  Diagnosis Date  . Cellulitis, leg   . GERD (gastroesophageal reflux disease)   . Hypertension     Patient Active Problem List   Diagnosis Date Noted  . Essential hypertension, benign 10/17/2018  . Swelling of limb 10/17/2018  . Lymphedema 10/17/2018  . Benign neoplasm of descending colon   . Diverticulosis of large intestine without diverticulitis   . Benign neoplasm of cecum   . Stomach irritation   . Hypokalemia 07/25/2018  . 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 Thrombocytopenia ([place]) 07/11/2016  . Anemia 07/11/2016  . 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 Other pancytopenia ([place]) 06/27/2016  . Rectal discomfort 06/27/2016  . Preventive measure 06/27/2016    Past Surgical History:  Procedure Laterality Date  . COLONOSCOPY WITH PROPOFOL N/A 07/28/2018   Procedure: COLONOSCOPY WITH PROPOFOL;  Surgeon: Virgel Manifold, MD;  Location: [facility] ENDOSCOPY;  Service: Endoscopy;  Laterality: N/A;  . ESOPHAGOGASTRODUODENOSCOPY (EGD) WITH PROPOFOL N/A 07/28/2018   Procedure: ESOPHAGOGASTRODUODENOSCOPY (EGD) WITH PROPOFOL;  Surgeon: Virgel Manifold, MD;  Location: [facility] ENDOSCOPY;  Service: Endoscopy;  Laterality: N/A;    Prior to Admission medications   Medication Sig Start Date End Date Taking? Authorizing Provider  calcium acetate (PHOSLO) 667 MG capsule Take 1 capsule (667 mg total) by mouth 3 (three) times daily with meals. 07/28/18   Salary, Avel Peace, MD  NEOMYCIN-POLYMYXIN-HYDROCORTISONE (CORTISPORIN) 1 % SOLN otic solution Place 1 drop into the left ear 4 (four) times daily. 02/23/15   [provider]  pantoprazole ([place])  40 MG tablet Take 1 tablet (40 mg total) by mouth daily. 07/28/18 07/28/19  Salary, Avel Peace, MD  potassium chloride (K-DUR) 10 MEQ tablet Take 1 tablet (10 mEq total) by mouth daily. 07/28/18   Salary, Avel Peace, MD  tizanidine (ZANAFLEX) 2 MG capsule Take 2 mg by mouth 3 (three) times daily.     [provider]    Allergies Patient has no known allergies.  Family History  Problem Relation Age of Onset  . Cancer Mother        unknown primary    Social History Social History   Tobacco Use  . Smoking status: Never Smoker  . Smokeless tobacco: Never Used  Substance Use Topics  . Alcohol use: Yes    Comment: fell from drinking [employer]  . Drug use: No     Review of Systems  Constitutional: No fever/chills.  Patient states that she has felt "bad" x4 days.  No appetite. Eyes: No visual changes. No discharge ENT: No upper respiratory complaints. Cardiovascular: Patient complains of chest pain for EMS, denies chest pain. Respiratory: no cough. No SOB. Gastrointestinal: No abdominal pain.  No nausea, no vomiting.  No diarrhea.  No constipation. Genitourinary: Negative for dysuria. No hematuria Musculoskeletal: Negative for musculoskeletal pain. Skin: Negative for rash, abrasions, lacerations, ecchymosis. Neurological: Patient complains of a headache for EMS, denies headache at this time.  Denies focal weakness or numbness. 10-point ROS otherwise negative.  ____________________________________________   PHYSICAL EXAM:  VITAL SIGNS: ED Triage Vitals  Enc Vitals Group     BP      Pulse      Resp      Temp      Temp src      SpO2      Weight      Height      Head Circumference      Peak Flow      Pain Score      Pain Loc      Pain Edu?      Excl. in [place]?      Constitutional: Alert and oriented. Well appearing and in no acute distress. Eyes: Conjunctivae are normal. PERRL. EOMI. Head: Atraumatic. ENT:      Ears:       Nose: No congestion/rhinnorhea.      Mouth/Throat: Mucous membranes are dry.  Oropharynx is nonerythematous and nonedematous.  Uvula is midline. Neck: No stridor.  Neck is supple full range of motion.  No tenderness. Hematological/Lymphatic/Immunilogical: No cervical lymphadenopathy. Cardiovascular: Normal rate, regular  rhythm. Normal S1 and S2.  No murmurs, rubs, gallops.  No apical heave.  Good peripheral circulation. Respiratory: Normal respiratory effort without tachypnea or retractions. Lungs CTAB with no wheezing, rales or rhonchi.Kermit Balo air entry to the bases with no decreased or absent breath sounds. Gastrointestinal: Bowel sounds 4 quadrants. Soft and nontender to palpation. No guarding or rigidity. No palpable masses. No distention. No CVA tenderness. Musculoskeletal: Full range of motion to all extremities. No gross deformities appreciated. Neurologic:  Normal speech and language. No gross focal neurologic deficits are appreciated.  Cranial nerves II through XII were intact, however patient was very sluggish and following instructions.  Negative pronator drift.  Patient is reportedly too weak to perform Romberg's. Skin:  Skin is warm, dry and intact. No rash noted. Psychiatric: Mood and affect are normal. Speech and behavior are normal. Patient exhibits appropriate insight and judgement.   ____________________________________________   LABS (all labs ordered are listed,  but only abnormal results are displayed)  Labs Reviewed  CBC WITH DIFFERENTIAL/PLATELET - Abnormal; Notable for the following components:      Result Value   RBC 3.50 (*)    Hemoglobin 10.9 (*)    HCT 33.2 (*)    RDW 17.5 (*)    Platelets 80 (*)    All other components within normal limits  COMPREHENSIVE METABOLIC PANEL - Abnormal; Notable for the following components:   Potassium 2.6 (*)    Glucose, Bld 104 (*)    Creatinine, Ser 1.17 (*)    Total Protein 8.8 (*)    AST 58 (*)    Total Bilirubin 2.2 (*)    GFR calc non Af Amer 47 (*)    GFR calc Af Amer 54 (*)    Anion gap 20 (*)    All other components within normal limits  LACTIC ACID, PLASMA - Abnormal; Notable for the following components:   Lactic Acid, Venous 7.4 (*)    All other components within normal limits  URINALYSIS, COMPLETE (UACMP) WITH MICROSCOPIC -  Abnormal; Notable for the following components:   Color, Urine AMBER (*)    APPearance HAZY (*)    Bilirubin Urine SMALL (*)    Ketones, ur 5 (*)    Protein, ur 100 (*)    Bacteria, UA RARE (*)    All other components within normal limits  CK - Abnormal; Notable for the following components:   Total CK 34 (*)    All other components within normal limits  MAGNESIUM - Abnormal; Notable for the following components:   Magnesium 1.3 (*)    All other components within normal limits  TROPONIN I (HIGH SENSITIVITY) - Abnormal; Notable for the following components:   Troponin I (High Sensitivity) 94 (*)    All other components within normal limits  CULTURE, BLOOD (ROUTINE X 2)  CULTURE, BLOOD (ROUTINE X 2)  SARS CORONAVIRUS 2 (TAT 6-24 HRS)  AMMONIA  URINE DRUG SCREEN, QUALITATIVE ([facility] ONLY)  LACTIC ACID, PLASMA  CBG MONITORING, ED  CBG MONITORING, ED  TROPONIN I (HIGH SENSITIVITY)   ____________________________________________  EKG   ____________________________________________  RADIOLOGY I personally viewed and evaluated these images as part of my medical decision making, as well as reviewing the written report by the radiologist.  DG Chest 1 View  Result Date: 10/26/2019 CLINICAL DATA:  Seizure-like activity. Not feeling well for 4 days weakness, headache, decreased appetite. EXAM: CHEST  1 VIEW COMPARISON:  Chest x-ray dated 07/25/2018. FINDINGS: Stable cardiomegaly. Lungs are clear. No pleural effusion or pneumothorax is seen. Osseous structures about the chest are unremarkable. IMPRESSION: No active disease. No evidence of pneumonia or pulmonary edema. Stable cardiomegaly. Electronically Signed   By: Franki Cabot M.D.   On: 10/12/2019 13:53   CT Head Wo Contrast  Result Date: 10/17/2019 CLINICAL DATA:  Seizure-like activity. EXAM: CT HEAD WITHOUT CONTRAST TECHNIQUE: Contiguous axial images were obtained from the base of the skull through the vertex without intravenous  contrast. COMPARISON:  March 22, 2019 FINDINGS: Brain: No evidence of acute infarction, hemorrhage, hydrocephalus, extra-axial collection or mass lesion/mass effect. There is chronic diffuse atrophy. Mild chronic bilateral periventricular white matter small vessel ischemic changes are noted. Vascular: No hyperdense vessels noted. Skull: Normal. Negative for fracture or focal lesion. Sinuses/Orbits: No acute finding. Other: None. IMPRESSION: No focal acute intracranial abnormality identified. Chronic diffuse atrophy. Mild chronic bilateral periventricular white matter small vessel ischemic change. Electronically Signed   By: Abelardo Diesel  M.D.   On: 10/15/2019 13:38    ____________________________________________    PROCEDURES  Procedure(s) performed:    Procedures    Medications  sodium chloride 0.9 % bolus 1,000 mL (has no administration in time range)  potassium chloride 10 mEq in 100 mL IVPB (10 mEq Intravenous New Bag/Given 10/17/2019 1522)  potassium chloride SA (KLOR-CON) CR tablet 40 mEq (has no administration in time range)  potassium chloride 10 mEq in 100 mL IVPB (has no administration in time range)  sodium chloride 0.9 % bolus 1,000 mL (1,000 mLs Intravenous New Bag/Given 10/22/2019 1248)     ____________________________________________   INITIAL IMPRESSION / ASSESSMENT AND PLAN / ED COURSE  Pertinent labs & imaging results that were available during my care of the patient were reviewed by me and considered in my medical decision making (see chart for details).  Review of the [place] CSRS was performed in accordance of the [place] prior to dispensing any controlled drugs.  Clinical Course as of Oct 17 1628  Sun Oct 18, 2019  1233 Patient presents the emergency department complaining of malaise, weakness, loss of appetite x4 days.  EMS was called by family given altered mental status, weakness.  Initially patient was alert and oriented x4, refusing EMS service.  Eventually patient  agrees to transport and did have a witnessed seizure for approximately 10 to 15 seconds.  No medical intervention was performed during seizure and patient did not have a prolonged postictal state.  Patient was incontinent during seizure-like activity.  At this time, patient states that she feels very weak, no appetite but denies any other complaints.  Just before seizure-like activity patient stated to EMS that she had both headache and chest pain.  She states that this has resolved at this time.  At this time, patient will be evaluated with labs, imaging.  No medications at this time for seizure-like activity as patient has no resumption of seizure-like activity.  Patient does appear to be dehydrated on exam and will be given fluids.   [JC]  1236 Differential at this time includes CVA, ACS/STEMI, infectious etiology such as COVID-19, pneumonia, urinary tract infection.  Patient also with differential to include electrolyte abnormality, hypoglycemia/hyperglycemia.   [JC]    Clinical Course User Index [JC] Shelita Steptoe, Charline Bills, PA-C          Patient's diagnosis is consistent with altered mental status, hyperkalemia, dehydration, weakness, elevated troponin, seizure.  Patient presented to emergency department via EMS from home for complaint of altered mental status and weakness.  On EMS arrival, patient had no complaints, however they convinced her to present to the emergency department for evaluation.  In route, patient had a witnessed seizure lasting 10 to 15 seconds.  Patient did have bowel incontinence with this seizure-like activity.  Prior to administration of medications for seizure control, patient came out of seizure-like activity.  She has had no repeat of this activity.  Patient did complain of headache, chest pain directly prior to onset of seizure, however had no chest pain or headache prior to the seizure-like activity or following.  States that she feels exceptionally weak, "sick" but has  no specific complaints.  Patient does have hypokalemia with a potassium of 2.6.  Contact the hospitalist for admission.  Given patient's reported chest pain prior to seizure-like activity they did recommend discussing the case with cardiology to ensure no recommendations for possible cardiac origin.  Cardiology recommends observation but no treatments at this time.  Patient will be admitted to  the hospital service.     ____________________________________________  FINAL CLINICAL IMPRESSION(S) / ED DIAGNOSES  Final diagnoses:  Hypokalemia  Dehydration  Altered mental status, unspecified altered mental status type  Weakness  Elevated troponin  Seizure (HCC)      NEW MEDICATIONS STARTED DURING THIS VISIT:  ED Discharge Orders    None          This chart was dictated using voice recognition software/Dragon. Despite best efforts to proofread, errors can occur which can change the meaning. Any change was purely unintentional.    Darletta Moll, PA-C 10/30/2019 1715    Delman Kitten, MD 11-04-19 2030

## 2019-10-18 NOTE — ED Notes (Addendum)
Unable to transport pt to floor due to low b/p. Notified Ariel, RN about b/p. Will consult MD before transferring pt to her room

## 2019-10-18 NOTE — Consult Note (Signed)
Name: Morgan Mcpherson MRN: [id number] DOB: Aug 08, 1946    ADMISSION DATE:  11/01/2019 CONSULTATION DATE:  10/12/2019  REFERRING MD :  Sharion Settler, NP  CHIEF COMPLAINT:  Hypotension  BRIEF PATIENT DESCRIPTION:  74 y.o. female with PMH of liver cirrhosis secondary to ETOH abuse, HTN, GERD, who is admitted 2/14 with Septic shock secondary to colitis (infectious vs. Ischemic), with possible component of hypovolemic and cardiogenic shock.  Also found to have acute pancreatitis.  General Surgery and Cardiology are consulted.  SIGNIFICANT EVENTS  2/14- prresented to ED, to be admitted to Telemetry 2/14- Became hypotensive along with A-fib w/ RVR in ED 2/14- Requiring vasopressors, PCCM consulted 2/14- Left femoral CVC placed  STUDIES:  2/14- CT Head w/o Contrast>> No focal acute intracranial abnormality identified. Chronic diffuseatrophy. Mild chronic bilateral periventricular white matter small vessel ischemic change. 2/14-  CXR>>No active disease. No evidence of pneumonia or pulmonary edema. Stable cardiomegaly.  2/14- CT Abdomen & Pelvis w/o contrast>>1. Inflammatory changes surrounding the pancreas, consistent with acute uncomplicated pancreatitis. Please correlate with laboratory values. 2. High attenuation material within the gallbladder, likely sludge. No CT evidence of cholecystitis. 3. Mild mural thickening from the cecum to the distal transverse colon. This could reflect inflammatory or infectious colitis, though the distribution of findings coupled with the history of elevated lactic acid raise the possibility of ischemic colitis. Please correlate with clinical exam findings. 4. Diffuse fatty infiltration of the liver. 5. Descending colonic diverticulosis without diverticulitis.  CULTURES: SARS-CoV-2 PCR 2/14>>Negative Blood culture x2 2/14>> Urine 2/14>> Sputum 2/14>> Strep pneumo urinary antigen 2/14>> Legionella urinary antigen 2/14>>  ANTIBIOTICS: Zosyn 2/14 x1  dose Ciprofloxacin 2/14>> Flagyl 2/14>> Vancomycin 2/14>>  HISTORY OF PRESENT ILLNESS:   Morgan Mcpherson is a 74 y.o. Female with a past medical history of cirrhosis of the liver, EtOH abuse, hypertension, GERD, cellulitis who presents to [facility] ED on 11/15/2019 with complaints of altered mental status and weakness.  Her family reports that for the past 4 days she has not felt well, had poor p.o. intake, and hasn't gotten up from her chair.  EMS reports that patient did have a witnessed seizure lasting 10 to 15 seconds with incontinence while in route to the hospital of which she did recover without medical intervention.  Prior to the onset of the seizure she did complain of headache and chest pain.   Upon arrival to the ED she was noted to be soiled.  She was afebrile, tachycardic with HR 102, BP 104/59, and 100% SpO2 on room air.   While in the ED she denied headache, vision changes, neck pain, chest pain, shortness of breath, abdominal pain, diarrhea, nausea, vomiting, or dysuria. She did report epigastric pain.  Initial workup included negative CT Head, CXR normal. Urinalysis is not concerning for UTI. Her COVID-19 PCR is negative. Labs notable for potassium 2.6, Creatinine 1.17, total bilirubin 2.2, lactic acid 7.4, high sensitivity troponin 94. EKG with any acute ischemic changes.  She was to be admitted to the Telemetry by the Hospitalist for further workup and treatment of generalized weakness, lactic acidosis, and hypokalemia and hypomagnesemia.  Cardiology was consulted.  While awaiting for bed placement, she became hypotensive with BP 74/48, worsening altered mental status, and developed Atrial fibrillation with RVR.  She was placed on Levophed gtt, given IV Fluid bolus, and given 5 mg IV metoprolol.  Her HR improved to the 70's-80's, but remained hypotensive with increasing lactic acid.  PCCM was consulted to assist with management of hypotension.  Further workup is notable for procalcitonin 2.31 and  lactic acid 7.6.  CT abdomen & pelvis without contrast obtained which revealed acute pancreatitis and mild mural thickening from the cecum to the distal transverse colon concerning for inflammatory/infectious colitis vs. Ischemic colitis.  She was placed on empiric antibiotics and general surgery was consulted.  Central line was placed emergently for vasopressor administration.  She is now to be admitted to ICU for further workup and treatment of Septic shock secondary to colitis (infectious vs. Ischemic), with possible componnent of hypovolemic and cardiogenic shock.    PAST MEDICAL HISTORY :   has a past medical history of Cellulitis, leg, Cirrhosis of liver (Morgan Mcpherson), GERD (gastroesophageal reflux disease), and Hypertension.  has a past surgical history that includes Esophagogastroduodenoscopy (egd) with propofol (N/A, 07/28/2018) and Colonoscopy with propofol (N/A, 07/28/2018). Prior to Admission medications   Not on File   No Known Allergies  FAMILY HISTORY:  family history includes Cancer in her mother. SOCIAL HISTORY:  reports that she has never smoked. She has never used smokeless tobacco. She reports current alcohol use. She reports that she does not use drugs.   COVID-19 DISASTER DECLARATION:  FULL CONTACT PHYSICAL EXAMINATION WAS NOT POSSIBLE DUE TO TREATMENT OF COVID-19 AND  CONSERVATION OF PERSONAL PROTECTIVE EQUIPMENT, LIMITED EXAM FINDINGS INCLUDE-  Patient assessed or the symptoms described in the history of present illness.  In the context of the Global COVID-19 pandemic, which necessitated consideration that the patient might be at risk for infection with the SARS-CoV-2 virus that causes COVID-19, Institutional protocols and algorithms that pertain to the evaluation of patients at risk for COVID-19 are in a state of rapid change based on information released by regulatory bodies including the CDC and federal and state organizations. These policies and algorithms were followed  during the patient's care while in hospital.  REVIEW OF SYSTEMS:   Unable to assess due to AMS & Critical illness  SUBJECTIVE:  Unable to assess due to AMS & Critical illness  VITAL SIGNS: Temp:  [97.8 F (36.6 C)] 97.8 F (36.6 C) (02/14 1222) Pulse Rate:  [76-105] 85 (02/14 2036) Resp:  [18-24] 18 (02/14 2022) BP: (72-115)/(41-68) 115/60 (02/14 2036) SpO2:  [92 %-100 %] 100 % (02/14 2036) Weight:  [74.8 kg] 74.8 kg (02/14 1219)  PHYSICAL EXAMINATION: General: Critically ill-appearing female, laying in bed, on nonrebreather mask, in no acute distress Neuro: Lethargic, arouses to voice, oriented only to self, follows simple commands, no focal deficits, speech clear, pupils PERRLA HEENT: Atraumatic, normocephalic, neck supple, no JVD Cardiovascular: Regular rhythm, rate controlled, s1s2, no M/R/G Lungs: Clear to auscultation, no wheezing or rhonchi noted, even, nonlabored, normal effort Abdomen: Obese, slightly distended, soft, tender to epigastric region, no guarding or rebound tenderness Musculoskeletal: No deformities, no edema Skin: Extremely dry dry, chronic venous dermatitis changes, no obvious rashes, lesions, ulcerations  Recent Labs  Lab 10/22/2019 1241  NA 144  K 2.6*  CL 102  CO2 22  BUN 11  CREATININE 1.17*  GLUCOSE 104*   Recent Labs  Lab 10/08/2019 1241  HGB 10.9*  HCT 33.2*  WBC 4.6  PLT 80*   DG Chest 1 View  Result Date: 10/16/2019 CLINICAL DATA:  Seizure-like activity. Not feeling well for 4 days weakness, headache, decreased appetite. EXAM: CHEST  1 VIEW COMPARISON:  Chest x-ray dated 07/25/2018. FINDINGS: Stable cardiomegaly. Lungs are clear. No pleural effusion or pneumothorax is seen. Osseous structures about the chest are unremarkable. IMPRESSION: No active disease. No evidence of  pneumonia or pulmonary edema. Stable cardiomegaly. Electronically Signed   By: Franki Cabot M.D.   On: 10/14/2019 13:53   CT Head Wo Contrast  Result Date:  10/31/2019 CLINICAL DATA:  Seizure-like activity. EXAM: CT HEAD WITHOUT CONTRAST TECHNIQUE: Contiguous axial images were obtained from the base of the skull through the vertex without intravenous contrast. COMPARISON:  March 22, 2019 FINDINGS: Brain: No evidence of acute infarction, hemorrhage, hydrocephalus, extra-axial collection or mass lesion/mass effect. There is chronic diffuse atrophy. Mild chronic bilateral periventricular white matter small vessel ischemic changes are noted. Vascular: No hyperdense vessels noted. Skull: Normal. Negative for fracture or focal lesion. Sinuses/Orbits: No acute finding. Other: None. IMPRESSION: No focal acute intracranial abnormality identified. Chronic diffuse atrophy. Mild chronic bilateral periventricular white matter small vessel ischemic change. Electronically Signed   By: Abelardo Diesel M.D.   On: 10/12/2019 13:38    ASSESSMENT / PLAN:  Septic shock +/- Cardiogenic shock & Hypovolemic shock Mildly elevated troponin, likely demand ischemia Hx:  HTN -Continuous cardiac monitoring -Maintain MAP greater than 65 -IV fluids -Received IV fluid resuscitation in the ED -Levophed as needed to maintain MAP greater than 65 -2D echocardiogram pending -Cardiology following, appreciate input -D/c Heparin gtt due to Thrombocytopenia and troponin downtrending -Perform sepsis work-up -Check cortisol and TSH -Obtain CT abdomen due to abdominal tenderness  Sepsis secondary to infectious/inflamatory colitis vs. Ischemic colitis -Monitor fever Curve -Trend WBCs and procalcitonin -Follow pancultures as above -Received Zosyn x1 dose 2/14 -Will change to ciprofloxacin and Flagyl due to thrombocytopenia -CXR without evidence of Pneumonia or any acute process -Urinalysis not concerning for UTI -No evidence of cellulitis of lower extremities (has hx of cellulitis to LE) -If worsening sepsis, will obtain CT Abdomen & Pelvis with contrast and CTA Chest as per General Surgery  recommendations  Acute Hypoxic Respiratory Failure, likely secondary to metabolic acidosis -Supplemental O2 as needed to maintain O2 sats greater than 92 -Follow intermittent chest x-ray and ABG as needed -Chest x-ray on 2/14 without evidence of pneumonia or any other acute process -Currently respiratory status has improved, if worsens will need to obtain CTA chest to rule out PE -As needed bronchodilators  AKI on CKD stage III Hypokalemia Hypomagnesemia Lactic acidosis -Monitor I&O's / urinary output -Follow BMP -Ensure adequate renal perfusion -Avoid nephrotoxic agents as able -Replace electrolytes as indicated -Will consult pharmacy for assistance in electrolyte replacement -IV fluids -Trend lactic acid until normalized -Given lactic acid continuing to increase, obtain CT abdomen & pelvis  Acute Pancreatitis Inflammatory/infectious colitis vs. Ischemic colitis on CT Abdomen & Pelvis Hx: Cirrhosis secondary to alcohol, hepatic steatosis, GERD -N.p.o. for now -Trend LFTs -IV fluids -Placed on Ciprofloxacin & Flagyl -Pain management -Ammonia and Lipase both normal -General surgery consulted, appreciate input  Chronic Thrombocytopenia Hx: Pancytopenia -Monitor for S/Sx of bleeding -Trend CBC -Heparin SQ for VTE Prophylaxis  -Transfuse for Hgb <7 -Transfuse platelets for platelet count <50 and active bleeding      Disposition: ICU Goals of care: Full code VTE prophylaxis: Heparin SQ Stress ulcer prophylaxis: Protonix Updates: No family present at bedside during NP rounds 2/14, patient with altered mental status for   Patient's prognosis is guarded, she has high risk for cardiac arrest and death     Darel Hong, [facility] [place] Pager: [phone]  10/15/2019, 8:39 PM

## 2019-10-18 NOTE — ED Notes (Signed)
Dr Reesa Chew notified of bp drop to 74/48- VO given for NS bolus

## 2019-10-18 NOTE — Progress Notes (Signed)
Pharmacy Antibiotic Note  Morgan Mcpherson is a 74 y.o. female admitted on 11/01/2019 with intra-abdominal infection.  Pharmacy initially consulted for Zosyn dosing. Provider has discontinued Zosyn and would like to change to Cipro  Plan: Cipro 400mg  IV q12h  Height: 5\' 2"  (157.5 cm) Weight: 165 lb (74.8 kg) IBW/kg (Calculated) : 50.1  Temp (24hrs), Avg:97.4 F (36.3 C), Min:97 F (36.1 C), Max:97.8 F (36.6 C)  Recent Labs  Lab 10/08/2019 1240 10/29/2019 1241 10/23/2019 1507 10/20/2019 2002  WBC  --  4.6  --   --   CREATININE  --  1.17*  --   --   LATICACIDVEN 7.4*  --  6.6* 7.6*    Estimated Creatinine Clearance: 41.8 mL/min (A) (by C-G formula based on SCr of 1.17 mg/dL (H)).    No Known Allergies  Antimicrobials this admission:   >>    >>   Dose adjustments this admission:   Microbiology results:  BCx:   UCx:    Sputum:    MRSA PCR:   Thank you for allowing pharmacy to be a part of this patient's care.  Paulina Fusi, PharmD, BCPS 10/10/2019 11:39 PM

## 2019-10-18 NOTE — ED Notes (Signed)
Pt returned from ct

## 2019-10-18 NOTE — Progress Notes (Addendum)
Patient is  receiving fluid resuscitation for sepsis treatment. I was called to bedside with patient having mental status change and low BP with systolic [id number].  Also developed afib with RVR rate 150s. Levo initiated. ER physician assisted with large bore peripheral access. 5 mg iv metoprolol given. Rate greatly improved 70-80'. Still in atrial fib.   Heparin drip ordered.  Patient noted to have approx 2sec sinus pause as well. troponin elevation down trending noted earlier. Mentation improving patient denies chest pain and no respiratory difficulty lying flat.  Lactic acid trending up, additional fluids given levoped at 10 mcg/min Phos replacement ordered Discussed case with Darel Hong NP as patient now will require ICU status and care. Family member contact identified in EMR called to update. Message left on voicemail to return call. Given her presenting symptoms of fatigue and weakness in state of covid pandemic she was placed as under investigation for virus.  This has since resulted as negative and precautions no longer needed.

## 2019-10-18 NOTE — Progress Notes (Signed)
Pharmacy Antibiotic Note  Morgan Mcpherson is a 74 y.o. female admitted on 10/15/2019 with intra-abdominal infection.  Pharmacy has been consulted for Zosyn dosing.  Plan: Zosyn 3.375g IV q8h (4 hour infusion).  Height: 5\' 2"  (157.5 cm) Weight: 165 lb (74.8 kg) IBW/kg (Calculated) : 50.1  Temp (24hrs), Avg:97.8 F (36.6 C), Min:97.8 F (36.6 C), Max:97.8 F (36.6 C)  Recent Labs  Lab 10/28/2019 1240 10/09/2019 1241 10/14/2019 1507 10/31/2019 2002  WBC  --  4.6  --   --   CREATININE  --  1.17*  --   --   LATICACIDVEN 7.4*  --  6.6* 7.6*    Estimated Creatinine Clearance: 41.8 mL/min (A) (by C-G formula based on SCr of 1.17 mg/dL (H)).    No Known Allergies  Antimicrobials this admission:   >>    >>   Dose adjustments this admission:   Microbiology results:  BCx:   UCx:    Sputum:    MRSA PCR:   Thank you for allowing pharmacy to be a part of this patient's care.  Curtis Cain D 10/21/2019 10:18 PM

## 2019-10-18 NOTE — Progress Notes (Signed)
ANTICOAGULATION CONSULT NOTE - Initial Consult  Pharmacy Consult for Heparin  Indication: atrial fibrillation  No Known Allergies  Patient Measurements: Height: 5\' 2"  (157.5 cm) Weight: 165 lb (74.8 kg) IBW/kg (Calculated) : 50.1 Heparin Dosing Weight: 66.3 kg   Vital Signs: Temp: 97.8 F (36.6 C) (02/14 1222) Temp Source: Axillary (02/14 1222) BP: 87/59 (02/14 2014) Pulse Rate: 81 (02/14 2014)  Labs: Recent Labs    10/31/2019 1240 10/17/2019 1241 10/21/2019 1507  HGB  --  10.9*  --   HCT  --  33.2*  --   PLT  --  80*  --   CREATININE  --  1.17*  --   CKTOTAL 34*  --   --   TROPONINIHS  --  94* 79*    Estimated Creatinine Clearance: 41.8 mL/min (A) (by C-G formula based on SCr of 1.17 mg/dL (H)).   Medical History: Past Medical History:  Diagnosis Date  . Cellulitis, leg   . Cirrhosis of liver ([place])    Per granddaughter  . GERD (gastroesophageal reflux disease)   . Hypertension     Medications:  (Not in a hospital admission)   Assessment: Pharmacy consulted to dose heparin this 74 year old female admitted with AFib.  CrCl = 41.8 ml/min ,  No prior anticoag noted   Goal of Therapy:  Heparin level 0.3-0.7 units/ml Monitor platelets by anticoagulation protocol: Yes   Plan:  Give 4000 units bolus x 1 Start heparin infusion at 1000 units/hr Check anti-Xa level in 8 hours and daily while on heparin Continue to monitor H&H and platelets  Kirstin Kugler D 10/09/2019,8:16 PM

## 2019-10-18 NOTE — ED Notes (Signed)
Lab here to draw blue top, previous PTT expired.

## 2019-10-18 NOTE — ED Notes (Signed)
Report from [place], rn

## 2019-10-18 NOTE — ED Notes (Signed)
Critical lactic acid of 7.6 and phosphorus of less than 1 called from lab. Sharion Settler, np notified, order for additional 2LLR bolus received.

## 2019-10-18 NOTE — ED Notes (Signed)
Critical care nurse practitioner Darel Hong here to place central line. Will place emergently per [place], np due to pt confusion. Heparin held at this time to prepare for central line placement per [place], rn.

## 2019-10-18 NOTE — ED Notes (Signed)
Transporting pt to room #233 by stretcher.

## 2019-10-18 NOTE — ED Triage Notes (Addendum)
Pt arrives [facility] from home for decline in health over past 4 days. EMS encoded as emergency traffic d/t pt having seizure like activity for 10-15 seconds while enroute. Pt arrives A&O, answering all questions. Pt states "I havn't been feeling well for 4 days. I feel weak, have a headache, and no appetite." pt denies pain at this time. EMS states she c/o of CP prior to seizure like activity and c/o HA after. Speech clear, moving all extremities on own. CBG 121. Hx HTN. EMS reported that pt has been sitting in urine for "a while"

## 2019-10-18 NOTE — ED Notes (Signed)
Report given to Denise, RN.

## 2019-10-18 NOTE — ED Notes (Addendum)
Nasal pulse ox probe placed on pt. Please see printed out vital sign sheet in chart for q5 min blood pressures, hr, resps,

## 2019-10-18 NOTE — H&P (Signed)
History and Physical    Morgan Mcpherson [id number] DOB: [birth year] DOA: 10/26/2019  PCP: Donnie Coffin, MD   Patient coming from: Home  I have personally briefly reviewed patient's old medical records in [place]  Chief Complaint: Not feeling well.  HPI: Morgan Mcpherson is a 74 y.o. female with medical history significant of hypertension, thrombocytopenia, MGUS and hypokalemia was brought to ED via EMS with complaint of not feeling well for the past 4 days.  Apparently patient was not feeling well for the past 4 days.  She spent most of her time in 1 chair.  Per EMS she was sitting on her urine and feces.  Poor p.o. intake for the past 4 days.  Neighbor called the EMS due to altered mental status but patient was alert and oriented x4.  She initially refused to go with EMS and then agreed for a checkup.  According to EMS report patient complained of some chest pain and headache which was soon followed by some jerky movements lasted for 10 to 15 minutes, patient did become incontinent, resolved without any intervention, no postictal status.  No prior history of seizure-like activity.  According to patient she was not feeling well and having some exertional dyspnea for a long time, no recent change in that.  For the past week she was gradually becoming weaker, had couple of falls, unable to do her ADLs and spending most of her time in recliner. She denies any orthopnea or PND.  She normally sleeps in couch stating that her bed is too high for her and she was becoming weak to climb on it.  She was able to lay flat.  She denies any chest pain.  Denies any upper respiratory symptoms, no fever or chills.  Intermittent nausea and vomiting.  Her appetite was decreased for the past few days and she did not eat anything.  Denies any diarrhea.  She was having some epigastric pain.  Denies any urinary symptoms.  She was not sure which medications she take it at home.  Denies any sick contact.  She lives  alone and her neighbors periodically checks on her.  She has 1 sister and no kids.  ED Course: She was hemodynamically stable with mildly softer blood pressure at 104/59, appears dry, CT head and chest x-ray without any acute change.  Labs positive for potassium of 2.6, creatinine of 1.17 with baseline below 1, T bili of 2.2, profound lactic acidosis of 7.4, mildly elevated troponin at 94.  EKG without any acute change.  Review of Systems: As per HPI otherwise 10 point review of systems negative.   Past Medical History:  Diagnosis Date  . Cellulitis, leg   . GERD (gastroesophageal reflux disease)   . Hypertension     Past Surgical History:  Procedure Laterality Date  . COLONOSCOPY WITH PROPOFOL N/A 07/28/2018   Procedure: COLONOSCOPY WITH PROPOFOL;  Surgeon: Virgel Manifold, MD;  Location: [facility] ENDOSCOPY;  Service: Endoscopy;  Laterality: N/A;  . ESOPHAGOGASTRODUODENOSCOPY (EGD) WITH PROPOFOL N/A 07/28/2018   Procedure: ESOPHAGOGASTRODUODENOSCOPY (EGD) WITH PROPOFOL;  Surgeon: Virgel Manifold, MD;  Location: [facility] ENDOSCOPY;  Service: Endoscopy;  Laterality: N/A;     reports that she has never smoked. She has never used smokeless tobacco. She reports current alcohol use. She reports that she does not use drugs.  No Known Allergies  Family History  Problem Relation Age of Onset  . Cancer Mother        unknown primary  Prior to Admission medications   Medication Sig Start Date End Date Taking? Authorizing Provider  calcium acetate (PHOSLO) 667 MG capsule Take 1 capsule (667 mg total) by mouth 3 (three) times daily with meals. 07/28/18   Salary, Avel Peace, MD  NEOMYCIN-POLYMYXIN-HYDROCORTISONE (CORTISPORIN) 1 % SOLN otic solution Place 1 drop into the left ear 4 (four) times daily. 02/23/15   [provider]  pantoprazole (PROTONIX) 40 MG tablet Take 1 tablet (40 mg total) by mouth daily. 07/28/18 07/28/19  Salary, Avel Peace, MD  potassium chloride (K-DUR) 10 MEQ  tablet Take 1 tablet (10 mEq total) by mouth daily. 07/28/18   Salary, Avel Peace, MD  tizanidine (ZANAFLEX) 2 MG capsule Take 2 mg by mouth 3 (three) times daily.    [provider]    Physical Exam: Vitals:   10/31/2019 1219 10/12/2019 1222 10/21/2019 1630 10/17/2019 1645  BP:  (!) 104/59    Pulse:   (!) 105 82  Resp:  20 18 (!) 24  Temp:  97.8 F (36.6 C)    TempSrc:  Axillary    SpO2:  100% 100% 99%  Weight: 74.8 kg     Height: 5\' 2"  (1.575 m)       General: Vital signs reviewed.  Patient is chronically ill-appearing, frail elderly lady, in no acute distress and cooperative with exam.  Head: Normocephalic and atraumatic. Eyes: EOMI, conjunctivae normal, mild scleral icterus.  ENMT: Mucous membranes are dry.  Neck: Supple, trachea midline, normal ROM, no JVD, masses, thyromegaly, or carotid bruit present.  Cardiovascular: RRR, S1 normal, S2 normal, no murmurs, gallops, or rubs. Pulmonary/Chest: Clear to auscultation bilaterally, no wheezes, rales, or rhonchi. Abdominal: Soft, mild epigastric tenderness and appears distended, BS +,  Extremities: Chronic hyperpigmentation with variation in color, extremely dry skin, chronic venous dermatitis changes.  Mild lymphedema. Neurological: A&O x3, Strength is normal and symmetric bilaterally, cranial nerve II-XII are grossly intact, no focal motor deficit, sensory intact to light touch bilaterally.  Psychiatric: Normal mood and affect.   Labs on Admission: I have personally reviewed following labs and imaging studies  CBC: Recent Labs  Lab 10/10/2019 1241  WBC 4.6  NEUTROABS 3.5  HGB 10.9*  HCT 33.2*  MCV 94.9  PLT 80*   Basic Metabolic Panel: Recent Labs  Lab 10/14/2019 1241 10/12/2019 1507  NA 144  --   K 2.6*  --   CL 102  --   CO2 22  --   GLUCOSE 104*  --   BUN 11  --   CREATININE 1.17*  --   CALCIUM 9.5  --   MG  --  1.3*   GFR: Estimated Creatinine Clearance: 41.8 mL/min (A) (by C-G formula based on SCr of 1.17  mg/dL (H)). Liver Function Tests: Recent Labs  Lab 10/09/2019 1241  AST 58*  ALT 16  ALKPHOS 108  BILITOT 2.2*  PROT 8.8*  ALBUMIN 4.1   No results for input(s): LIPASE, AMYLASE in the last 168 hours. Recent Labs  Lab 10/25/2019 1240  AMMONIA 33   Coagulation Profile: No results for input(s): INR, PROTIME in the last 168 hours. Cardiac Enzymes: Recent Labs  Lab 10/13/2019 1240  CKTOTAL 34*   BNP (last 3 results) No results for input(s): PROBNP in the last 8760 hours. HbA1C: No results for input(s): HGBA1C in the last 72 hours. CBG: No results for input(s): GLUCAP in the last 168 hours. Lipid Profile: No results for input(s): CHOL, HDL, LDLCALC, TRIG, CHOLHDL, LDLDIRECT in the  last 72 hours. Thyroid Function Tests: No results for input(s): TSH, T4TOTAL, FREET4, T3FREE, THYROIDAB in the last 72 hours. Anemia Panel: No results for input(s): VITAMINB12, FOLATE, FERRITIN, TIBC, IRON, RETICCTPCT in the last 72 hours. Urine analysis:    Component Value Date/Time   COLORURINE AMBER (A) 10/25/2019 1259   APPEARANCEUR HAZY (A) 10/20/2019 1259   APPEARANCEUR Hazy 01/10/2014 2039   LABSPEC 1.023 10/27/2019 1259   LABSPEC 1.021 01/10/2014 2039   PHURINE 5.0 10/23/2019 [place] 10/26/2019 1259   GLUCOSEU Negative 01/10/2014 2039   HGBUR NEGATIVE 10/05/2019 1259   BILIRUBINUR SMALL (A) 10/25/2019 1259   BILIRUBINUR Negative 01/10/2014 2039   KETONESUR 5 (A) 10/10/2019 1259   PROTEINUR 100 (A) 10/10/2019 1259   NITRITE NEGATIVE 10/05/2019 1259   LEUKOCYTESUR NEGATIVE 10/27/2019 1259   LEUKOCYTESUR Negative 01/10/2014 2039    Radiological Exams on Admission: DG Chest 1 View  Result Date: 10/11/2019 CLINICAL DATA:  Seizure-like activity. Not feeling well for 4 days weakness, headache, decreased appetite. EXAM: CHEST  1 VIEW COMPARISON:  Chest x-ray dated 07/25/2018. FINDINGS: Stable cardiomegaly. Lungs are clear. No pleural effusion or pneumothorax is seen.  Osseous structures about the chest are unremarkable. IMPRESSION: No active disease. No evidence of pneumonia or pulmonary edema. Stable cardiomegaly. Electronically Signed   By: Franki Cabot M.D.   On: 10/22/2019 13:53   CT Head Wo Contrast  Result Date: 10/26/2019 CLINICAL DATA:  Seizure-like activity. EXAM: CT HEAD WITHOUT CONTRAST TECHNIQUE: Contiguous axial images were obtained from the base of the skull through the vertex without intravenous contrast. COMPARISON:  March 22, 2019 FINDINGS: Brain: No evidence of acute infarction, hemorrhage, hydrocephalus, extra-axial collection or mass lesion/mass effect. There is chronic diffuse atrophy. Mild chronic bilateral periventricular white matter small vessel ischemic changes are noted. Vascular: No hyperdense vessels noted. Skull: Normal. Negative for fracture or focal lesion. Sinuses/Orbits: No acute finding. Other: None. IMPRESSION: No focal acute intracranial abnormality identified. Chronic diffuse atrophy. Mild chronic bilateral periventricular white matter small vessel ischemic change. Electronically Signed   By: Abelardo Diesel M.D.   On: 10/27/2019 13:38    EKG: Independently reviewed.  Sinus rhythm with T wave inversion in aVR and aVL with Q waves in anteroinferior leads, similar changes present on prior EKG done in 2019.  Assessment/Plan Active Problems:   Hypokalemia   Hypokalemia with hypomagnesemia.  Patient has potassium of 2.6 with magnesium of 1.3.  No apparent GI losses, poor p.o. intake. -Replete electrolytes. -Continue to monitor.  Generalized weakness/lactic acidosis. she has very nonspecific symptoms.  She saw cardiology once in 2018 and a stress test along with an echocardiogram was ordered which was never done.  According to one telephone note that patient canceled that stating that she does not want any cardiac work-up. She has a lot of electrolyte derangements including profound hypokalemia, hypomagnesemia and lactic acidosis.   No leukocytosis or any obvious source of infection.  She is having a broad differential at this time.  She might have a cardiac event resulted in the symptoms with this much of lactic acidosis.  Clinically does not appear acidotic.  Mildly elevated troponin-can be due to demand or may be trending down from a prior cardiac event difficult to say.  CT head and chest x-ray without any acute changes.  She was not hypoxic, saturating well on room air. Clinically appears very dry-was given 2 L bolus. UA does not appear infected with no urinary symptoms. Blood cultures are pending. COVID-19 test  are pending. -Admit to telemetry. -Echocardiogram. -Trend troponin. -Continue IV fluid. -Continue trending lactic acid until normalized. -She will need PT/OT evaluation once ACS has been ruled out. -If echo is positive for any wall motion abnormalities or decreased ejection fraction-she will need a cardiology consult.  Epigastric pain/elevated T bili. patient has very nonspecific epigastric pain with tenderness along epigastrium and right upper quadrant region.  One of her prior PCP note stating that she might had alcoholic cirrhosis and she was referred to see GI which she never followed up.  Prior abdominal imaging with hepatic steatosis.  Elevated T bili might be due to dehydration or some hypoxic event. Ammonia level within normal limit. -Check lipase. -Ultrasound of abdomen.  History of thrombocytopenia.  Platelets at 80, 54-month ago it was 125.  One of her PCP note in 2017 mentioned most likely secondary to alcoholic liver disease.  History of MGUS.  It was being monitored until 2017 and there is no follow-up since then.  Hypertension.  Blood pressure on softer end.  There was no antihypertensives listed on her home meds.  Chronic lymphedema.  Managed by vascular surgery. No acute issue.   DVT prophylaxis: SCDs-patient has thrombocytopenia. Code Status: Full code Family Communication: No family  at bedside. Disposition Plan: Pending improvement-patient might need placement as live alone with history of frequent falls and overall decline in her health. Consults called: None Admission status: Inpatient.   Lorella Nimrod MD [facility]  If 7PM-7AM, please contact night-coverage [email or web address]  10/13/2019, 5:15 PM   This record has been created using Systems analyst. Errors have been sought and corrected,but may not always be located. Such creation errors do not reflect on the standard of care.

## 2019-10-18 NOTE — ED Provider Notes (Signed)
Medical screening examination/treatment/procedure(s) were conducted as a shared visit with non-physician practitioner(s) and myself.  I personally evaluated the patient during the encounter.     Delman Kitten, MD 10/24/2019 [phone]

## 2019-10-19 ENCOUNTER — Encounter: Payer: Self-pay | Admitting: Internal Medicine

## 2019-10-19 ENCOUNTER — Inpatient Hospital Stay: Payer: Medicare Other

## 2019-10-19 ENCOUNTER — Inpatient Hospital Stay: Admit: 2019-10-19 | Payer: Medicare Other

## 2019-10-19 DIAGNOSIS — R6521 Severe sepsis with septic shock: Secondary | ICD-10-CM

## 2019-10-19 DIAGNOSIS — A419 Sepsis, unspecified organism: Principal | ICD-10-CM

## 2019-10-19 DIAGNOSIS — I469 Cardiac arrest, cause unspecified: Secondary | ICD-10-CM

## 2019-10-19 LAB — COMPREHENSIVE METABOLIC PANEL
ALT: 14 U/L (ref 0–44)
AST: 67 U/L — ABNORMAL HIGH (ref 15–41)
Albumin: 2.6 g/dL — ABNORMAL LOW (ref 3.5–5.0)
Alkaline Phosphatase: 81 U/L (ref 38–126)
Anion gap: 22 — ABNORMAL HIGH (ref 5–15)
BUN: 11 mg/dL (ref 8–23)
CO2: 10 mmol/L — ABNORMAL LOW (ref 22–32)
Calcium: 7.2 mg/dL — ABNORMAL LOW (ref 8.9–10.3)
Chloride: 109 mmol/L (ref 98–111)
Creatinine, Ser: 1.04 mg/dL — ABNORMAL HIGH (ref 0.44–1.00)
GFR calc Af Amer: >60 mL/min (ref >60)
GFR calc non Af Amer: 54 mL/min — ABNORMAL LOW (ref >60)
Glucose, Bld: 104 mg/dL — ABNORMAL HIGH (ref 70–99)
Potassium: 2.7 mmol/L — CL (ref 3.5–5.1)
Sodium: 141 mmol/L (ref 135–145)
Total Bilirubin: 2.7 mg/dL — ABNORMAL HIGH (ref 0.3–1.2)
Total Protein: 5.9 g/dL — ABNORMAL LOW (ref 6.5–8.1)

## 2019-10-19 LAB — IRON AND TIBC
Iron: 95 ug/dL (ref 28–170)
TIBC: <70 ug/dL — ABNORMAL LOW (ref 250–450)

## 2019-10-19 LAB — GLUCOSE, CAPILLARY
Glucose-Capillary: 44 mg/dL — CL (ref 70–99)
Glucose-Capillary: 82 mg/dL (ref 70–99)
Glucose-Capillary: 85 mg/dL (ref 70–99)

## 2019-10-19 LAB — LIPASE, BLOOD: Lipase: 36 U/L (ref 11–51)

## 2019-10-19 LAB — STREP PNEUMONIAE URINARY ANTIGEN: Strep Pneumo Urinary Antigen: NEGATIVE

## 2019-10-19 LAB — CBC
HCT: 27.1 % — ABNORMAL LOW (ref 36.0–46.0)
Hemoglobin: 8.7 g/dL — ABNORMAL LOW (ref 12.0–15.0)
MCH: 31.3 pg (ref 26.0–34.0)
MCHC: 32.1 g/dL (ref 30.0–36.0)
MCV: 97.5 fL (ref 80.0–100.0)
Platelets: 80 10*3/uL — ABNORMAL LOW (ref 150–400)
RBC: 2.78 MIL/uL — ABNORMAL LOW (ref 3.87–5.11)
RDW: 18.6 % — ABNORMAL HIGH (ref 11.5–15.5)
WBC: 6.2 10*3/uL (ref 4.0–10.5)
nRBC: 0.3 % — ABNORMAL HIGH (ref 0.0–0.2)

## 2019-10-19 LAB — CK: Total CK: 37 U/L — ABNORMAL LOW (ref 38–234)

## 2019-10-19 LAB — URINE CULTURE: Culture: NO GROWTH

## 2019-10-19 LAB — FERRITIN: Ferritin: 1381 ng/mL — ABNORMAL HIGH (ref 11–307)

## 2019-10-19 LAB — FIBRIN DERIVATIVES D-DIMER (ARMC ONLY): Fibrin derivatives D-dimer (ARMC): 715.3 ng/mL (FEU) — ABNORMAL HIGH (ref 0.00–499.00)

## 2019-10-19 LAB — PROCALCITONIN: Procalcitonin: 1.61 ng/mL

## 2019-10-19 LAB — VITAMIN D 25 HYDROXY (VIT D DEFICIENCY, FRACTURES): Vit D, 25-Hydroxy: 4.91 ng/mL — ABNORMAL LOW (ref 30–100)

## 2019-10-19 LAB — MAGNESIUM: Magnesium: 2.4 mg/dL (ref 1.7–2.4)

## 2019-10-19 LAB — LACTIC ACID, PLASMA
Lactic Acid, Venous: 9.7 mmol/L (ref 0.5–1.9)
Lactic Acid, Venous: 10.5 mmol/L (ref 0.5–1.9)

## 2019-10-19 LAB — VITAMIN B12: Vitamin B-12: 370 pg/mL (ref 180–914)

## 2019-10-19 LAB — FOLATE: Folate: 6.4 ng/mL (ref >5.9)

## 2019-10-19 LAB — PHOSPHORUS: Phosphorus: 2.7 mg/dL (ref 2.5–4.6)

## 2019-10-19 MED ORDER — SODIUM BICARBONATE 8.4 % IV SOLN: 4.0000 meq | Freq: Once | INTRAVENOUS | Status: DC

## 2019-10-19 MED ORDER — SODIUM BICARBONATE-DEXTROSE 150-5 MEQ/L-% IV SOLN
150.0000 meq | INTRAVENOUS | Status: DC
  Administered 2019-10-19: 150 meq via INTRAVENOUS
  Filled 2019-10-19 (×4): qty 1000

## 2019-10-19 MED ORDER — IOHEXOL 350 MG/ML SOLN
100.0000 mL | Freq: Once | INTRAVENOUS | Status: AC | PRN
  Administered 2019-10-19: 100 mL via INTRAVENOUS

## 2019-10-19 MED ORDER — VANCOMYCIN HCL IN DEXTROSE 1-5 GM/200ML-% IV SOLN: 1000.0000 mg | INTRAVENOUS | Status: DC

## 2019-10-19 MED ORDER — LACTATED RINGERS IV SOLN: INTRAVENOUS | Status: AC

## 2019-10-19 MED ORDER — PROMETHAZINE HCL 25 MG/ML IJ SOLN
12.5000 mg | Freq: Once | INTRAMUSCULAR | Status: AC
  Administered 2019-10-19: 12.5 mg via INTRAVENOUS
  Filled 2019-10-19: qty 1

## 2019-10-19 MED ORDER — SODIUM BICARBONATE 8.4 % IV SOLN: INTRAVENOUS | Status: DC

## 2019-10-19 MED ORDER — FOLIC ACID 5 MG/ML IJ SOLN
1.0000 mg | Freq: Every day | INTRAMUSCULAR | Status: DC
  Filled 2019-10-19: qty 0.2

## 2019-10-19 MED ORDER — POTASSIUM CHLORIDE 10 MEQ/50ML IV SOLN
10.0000 meq | INTRAVENOUS | Status: DC
  Administered 2019-10-19 (×3): 10 meq via INTRAVENOUS
  Filled 2019-10-19 (×6): qty 50

## 2019-10-19 MED ORDER — PANTOPRAZOLE SODIUM 40 MG IV SOLR: 40.0000 mg | Freq: Two times a day (BID) | INTRAVENOUS | Status: DC

## 2019-10-19 MED ORDER — IOHEXOL 9 MG/ML PO SOLN: 500.0000 mL | Freq: Two times a day (BID) | ORAL | Status: DC | PRN

## 2019-10-19 MED ORDER — SODIUM BICARBONATE 8.4 % IV SOLN
100.0000 meq | Freq: Once | INTRAVENOUS | Status: AC
  Administered 2019-10-19: 100 meq via INTRAVENOUS
  Filled 2019-10-19: qty 100

## 2019-10-19 MED ORDER — VANCOMYCIN HCL 1500 MG/300ML IV SOLN
1500.0000 mg | Freq: Once | INTRAVENOUS | Status: AC
  Administered 2019-10-19: 1500 mg via INTRAVENOUS
  Filled 2019-10-19: qty 300

## 2019-10-23 LAB — CULTURE, BLOOD (ROUTINE X 2)
Culture: NO GROWTH
Culture: NO GROWTH
Special Requests: ADEQUATE

## 2019-11-02 NOTE — ED Notes (Signed)
Sharion Settler, np notified of repeat lactic of 8.1. order received to run the rest of the LR liter hanging now at 171ml/hr then decrease rate of next LR liter to 125ml/hr.

## 2019-11-02 NOTE — Procedures (Signed)
Central Venous Catheter Insertion Procedure Note Morgan Mcpherson [id number] [birth year]  Procedure: Insertion of Central Venous Catheter Indications: Assessment of intravascular volume, Drug and/or fluid administration and Frequent blood sampling  Procedure Details Consent: Unable to obtain consent because of emergent medical necessity. Time Out: Verified patient identification, verified procedure, site/side was marked, verified correct patient position, special equipment/implants available, medications/allergies/relevent history reviewed, required imaging and test results available.  Performed  Maximum sterile technique was used including antiseptics, cap, gloves, gown, hand hygiene, mask and sheet. Skin prep: Chlorhexidine; local anesthetic administered A antimicrobial bonded/coated triple lumen catheter was placed in the left femoral vein due to emergent situation using the Seldinger technique.  Evaluation Blood flow good Complications: No apparent complications Patient did tolerate procedure well. Chest X-ray ordered to verify placement.  CXR: Not needed, placed in left femoral vein.    Procedure was performed using Ultrasound for direct visualization of cannulization of Left Femoral vein.  Darel Hong, AGACNP-BC [facility] Pager: [phone]  Bradly Bienenstock October 21, 2019, 1:19 AM

## 2019-11-02 NOTE — Death Summary Note (Signed)
DEATH SUMMARY   Patient Details  Name: Morgan Mcpherson MRN: [id number] DOB: [birth year]  Admission/Discharge Information   Admit Date:  27-Oct-2019  Date of Death:   28-Oct-2019   Time of Death:  918AM  Length of Stay: 1  Referring Physician: Donnie Coffin, MD   Reason(s) for Hospitalization  SEVERE SEPTIC SHOCK AND ISCHEMIC COLITIS  Diagnoses  Preliminary cause of death: ISCHEMIC COLITIS, SEVERE ACIDOSIS, ISCHEMIC CARDIOMYOPATHY Secondary Diagnoses (including complications and co-morbidities):  Active Problems:   Hypokalemia   Septic shock [facility])   Brief Hospital Course (including significant findings, care, treatment, and services provided and events leading to death)  BRIEF PATIENT DESCRIPTION:  74 y.o. female with PMH of liver cirrhosis secondary to ETOH abuse, HTN, GERD, who is admitted October 26, 2022 with Septic shock secondary to colitis (infectious vs. Ischemic), with possible component of hypovolemic and cardiogenic shock.  Also found to have acute pancreatitis.  General Surgery and Cardiology are consulted.  SIGNIFICANT EVENTS  10-26-2022- prresented to ED, to be admitted to Telemetry 10-26-2022- Became hypotensive along with A-fib w/ RVR in ED 10-26-22- Requiring vasopressors, PCCM consulted 10/26/2022- Left femoral CVC placed  STUDIES:  10-26-2022- CT Head w/o Contrast>> No focal acute intracranial abnormality identified. Chronic diffuseatrophy. Mild chronic bilateral periventricular white matter small vessel ischemic change. 10-26-22-  CXR>>No active disease. No evidence of pneumonia or pulmonary edema. Stable cardiomegaly.  Oct 26, 2022- CT Abdomen & Pelvis w/o contrast>>1. Inflammatory changes surrounding the pancreas, consistent with acute uncomplicated pancreatitis. Please correlate with laboratory values. 2. High attenuation material within the gallbladder, likely sludge. No CT evidence of cholecystitis. 3. Mild mural thickening from the cecum to the distal transverse colon. This could reflect inflammatory  or infectious colitis, though the distribution of findings coupled with the history of elevated lactic acid raise the possibility of ischemic colitis. Please correlate with clinical exam findings. 4. Diffuse fatty infiltration of the liver. 5. Descending colonic diverticulosis without diverticulitis.  CULTURES: SARS-CoV-2 PCR 2/14>>Negative Blood culture x2 2/14>> Urine 2/14>> Sputum 2/14>> Strep pneumo urinary antigen 2/14>> Legionella urinary antigen 2/14>>  ANTIBIOTICS: Zosyn 10-26-2022 x1 dose Ciprofloxacin 26-Oct-2022>> Flagyl 2/14>> Vancomycin 10-26-22>>  HISTORY OF PRESENT ILLNESS:   Morgan Mcpherson is a 74 y.o. Female with a past medical history of cirrhosis of the liver, EtOH abuse, hypertension, GERD, cellulitis who presents to [facility] ED on 11/15/2019 with complaints of altered mental status and weakness.  Her family reports that for the past 4 days she has not felt well, had poor p.o. intake, and hasn't gotten up from her chair.  EMS reports that patient did have a witnessed seizure lasting 10 to 15 seconds with incontinence while in route to the hospital of which she did recover without medical intervention.  Prior to the onset of the seizure she did complain of headache and chest pain.   Upon arrival to the ED she was noted to be soiled.  She was afebrile, tachycardic with HR 102, BP 104/59, and 100% SpO2 on room air.   While in the ED she denied headache, vision changes, neck pain, chest pain, shortness of breath, abdominal pain, diarrhea, nausea, vomiting, or dysuria. She did report epigastric pain.  Initial workup included negative CT Head, CXR normal. Urinalysis is not concerning for UTI. Her COVID-19 PCR is negative. Labs notable for potassium 2.6, Creatinine 1.17, total bilirubin 2.2, lactic acid 7.4, high sensitivity troponin 94. EKG with any acute ischemic changes.  She was to be admitted to the Telemetry by the Hospitalist for further workup and  treatment of generalized weakness, lactic  acidosis, and hypokalemia and hypomagnesemia.  Cardiology was consulted.   Pertinent Labs and Studies  Significant Diagnostic Studies CT ABDOMEN PELVIS WO CONTRAST  Result Date: 10/14/2019 CLINICAL DATA:  Sepsis, elevated lactic acid EXAM: CT ABDOMEN AND PELVIS WITHOUT CONTRAST TECHNIQUE: Multidetector CT imaging of the abdomen and pelvis was performed following the standard protocol without IV contrast. COMPARISON:  04/16/2017 FINDINGS: Lower chest: Hypoventilatory changes are seen at the lung bases. Hepatobiliary: There is extensive fatty infiltration of the liver. Increased attenuation of gallbladder contents consistent with sludge. No fat stranding to suggest cholecystitis. Pancreas: There is mild fat stranding surrounding the pancreatic parenchyma which could reflect acute pancreatitis. Please correlate with laboratory values. No fluid collection or abscess. Spleen: Normal in size without focal abnormality. Adrenals/Urinary Tract: No urinary tract calculi or obstructive uropathy. The adrenals are unremarkable. Bladder is decompressed with a Foley catheter. Stomach/Bowel: There is no bowel obstruction or ileus. Evaluation is limited without intravenous and oral contrast. There is at least mild mural thickening involving the proximal colon from the cecum through the distal transverse colon. No evidence of pneumatosis. There is diverticulosis of the distal descending colon without diverticulitis. Vascular/Lymphatic: Multiple small lymph nodes are seen surrounding the pancreas, likely reactive. No pathologically enlarged lymph nodes. Minimal atherosclerosis of the aorta. Reproductive: Uterus and bilateral adnexa are unremarkable. Other: No abdominal wall hernia or abnormality. No abdominopelvic ascites. Musculoskeletal: There are no acute or destructive bony lesions. Reconstructed images demonstrate no additional findings. IMPRESSION: 1. Inflammatory changes surrounding the pancreas, consistent with acute  uncomplicated pancreatitis. Please correlate with laboratory values. 2. High attenuation material within the gallbladder, likely sludge. No CT evidence of cholecystitis. 3. Mild mural thickening from the cecum to the distal transverse colon. This could reflect inflammatory or infectious colitis, though the distribution of findings coupled with the history of elevated lactic acid raise the possibility of ischemic colitis. Please correlate with clinical exam findings. 4. Diffuse fatty infiltration of the liver. 5. Descending colonic diverticulosis without diverticulitis. Electronically Signed   By: Randa Ngo M.D.   On: 10/16/2019 22:07   DG Chest 1 View  Result Date: 2019/10/22 CLINICAL DATA:  NG tube placement. EXAM: CHEST  1 VIEW COMPARISON:  Chest radiograph 30 minutes prior. FINDINGS: Tip and side port of the enteric tube below the diaphragm in the stomach. Cardiomegaly is unchanged. Patchy opacity at the lung bases, small right pleural effusion unchanged. No pneumothorax. IMPRESSION: Tip and side port of the enteric tube below the diaphragm in the stomach. Electronically Signed   By: Keith Rake M.D.   On: Oct 22, 2019 06:24   DG Chest 1 View  Result Date: 11/01/2019 CLINICAL DATA:  Seizure-like activity. Not feeling well for 4 days weakness, headache, decreased appetite. EXAM: CHEST  1 VIEW COMPARISON:  Chest x-ray dated 07/25/2018. FINDINGS: Stable cardiomegaly. Lungs are clear. No pleural effusion or pneumothorax is seen. Osseous structures about the chest are unremarkable. IMPRESSION: No active disease. No evidence of pneumonia or pulmonary edema. Stable cardiomegaly. Electronically Signed   By: Franki Cabot M.D.   On: 10/21/2019 13:53   CT Head Wo Contrast  Result Date: 10/27/2019 CLINICAL DATA:  Seizure-like activity. EXAM: CT HEAD WITHOUT CONTRAST TECHNIQUE: Contiguous axial images were obtained from the base of the skull through the vertex without intravenous contrast. COMPARISON:   March 22, 2019 FINDINGS: Brain: No evidence of acute infarction, hemorrhage, hydrocephalus, extra-axial collection or mass lesion/mass effect. There is chronic diffuse atrophy. Mild chronic bilateral periventricular  white matter small vessel ischemic changes are noted. Vascular: No hyperdense vessels noted. Skull: Normal. Negative for fracture or focal lesion. Sinuses/Orbits: No acute finding. Other: None. IMPRESSION: No focal acute intracranial abnormality identified. Chronic diffuse atrophy. Mild chronic bilateral periventricular white matter small vessel ischemic change. Electronically Signed   By: Abelardo Diesel M.D.   On: 11/01/2019 13:38   DG Chest Port 1 View  Result Date: 11/16/2019 CLINICAL DATA:  Acute respiratory failure with hypoxia. EXAM: PORTABLE CHEST 1 VIEW COMPARISON:  Radiograph yesterday. FINDINGS: Unchanged cardiomegaly. Unchanged mediastinal contours. Developing vascular congestion with streaky opacities in the lung bases. Possible small right pleural effusion. No pneumothorax. IMPRESSION: Developing vascular congestion and streaky bibasilar opacities, likely atelectasis. Possible small right pleural effusion. Electronically Signed   By: Keith Rake M.D.   On: Nov 16, 2019 05:46    Microbiology Recent Results (from the past 240 hour(s))  Culture, blood (routine x 2)     Status: None (Preliminary result)   Collection Time: 10/26/2019 12:40 PM   Specimen: BLOOD  Result Value Ref Range Status   Specimen Description BLOOD RAC  Final   Special Requests   Final    BOTTLES DRAWN AEROBIC AND ANAEROBIC Blood Culture results may not be optimal due to an excessive volume of blood received in culture bottles   Culture   Final    NO GROWTH < 24 HOURS Performed at [facility], [address], [place], [place] [address]    Report Status PENDING  Incomplete  Culture, blood (routine x 2)     Status: None (Preliminary result)   Collection Time: 10/05/2019 12:40 PM   Specimen: BLOOD   Result Value Ref Range Status   Specimen Description BLOOD LAC  Final   Special Requests   Final    BOTTLES DRAWN AEROBIC AND ANAEROBIC Blood Culture adequate volume   Culture   Final    NO GROWTH < 24 HOURS Performed at [facility], [address] [place], [place] [address]    Report Status PENDING  Incomplete  SARS CORONAVIRUS 2 (TAT 6-24 HRS) Nasopharyngeal Nasopharyngeal Swab     Status: None   Collection Time: 10/09/2019 12:59 PM   Specimen: Nasopharyngeal Swab  Result Value Ref Range Status   SARS Coronavirus 2 NEGATIVE NEGATIVE Final    Comment: (NOTE) SARS-CoV-2 target nucleic acids are NOT DETECTED. The SARS-CoV-2 RNA is generally detectable in upper and lower respiratory specimens during the acute phase of infection. Negative results do not preclude SARS-CoV-2 infection, do not rule out co-infections with other pathogens, and should not be used as the sole basis for treatment or other patient management decisions. Negative results must be combined with clinical observations, patient history, and epidemiological information. The expected result is Negative. Fact Sheet for Patients: SugarRoll.be Fact Sheet for Healthcare Providers: https://www.woods-mathews.com/ This test is not yet approved or cleared by the [place] FDA and  has been authorized for detection and/or diagnosis of SARS-CoV-2 by FDA under an Emergency Use Authorization (EUA). This EUA will remain  in effect (meaning this test can be used) for the duration of the COVID-19 declaration under Section 56 4(b)(1) of the Act, 21 U.S.C. section 360bbb-3(b)(1), unless the authorization is terminated or revoked sooner. Performed at [facility], [place] [address], [place], [place] [address]     Lab Basic Metabolic Panel: Recent Labs  Lab 10/20/2019 1241 10/26/2019 1507 November 16, 2019 0411  NA 144  --  141  K 2.6*  --  2.7*  CL 102  --  109  CO2 22  --   10*  GLUCOSE 104*  --  104*  BUN 11  --  11  CREATININE 1.17*  --  1.04*  CALCIUM 9.5  --  7.2*  MG  --  1.3* 2.4  PHOS <1.0*  --  2.7   Liver Function Tests: Recent Labs  Lab 10/20/2019 1241 11-16-19 0411  AST 58* 67*  ALT 16 14  ALKPHOS 108 81  BILITOT 2.2* 2.7*  PROT 8.8* 5.9*  ALBUMIN 4.1 2.6*   Recent Labs  Lab 10/26/2019 1241 11-16-2019 0411  LIPASE 40 36   Recent Labs  Lab 10/23/2019 1240  AMMONIA 33   CBC: Recent Labs  Lab 10/31/2019 1241 Nov 16, 2019 0411  WBC 4.6 6.2  NEUTROABS 3.5  --   HGB 10.9* 8.7*  HCT 33.2* 27.1*  MCV 94.9 97.5  PLT 80* 80*   Cardiac Enzymes: Recent Labs  Lab 10/21/2019 1240 November 16, 2019 0216  CKTOTAL 34* 37*   Sepsis Labs: Recent Labs  Lab 11/01/2019 1241 10/13/2019 1507 10/06/2019 2002 10/18/2019 2328 Nov 16, 2019 0216 11/16/19 0410 11-16-19 0411  PROCALCITON 2.31  --   --   --   --   --  1.61  WBC 4.6  --   --   --   --   --  6.2  LATICACIDVEN  --    < > 7.6* 8.1* 9.7* 10.5*  --    < > = values in this interval not displayed.      Flora Lipps 2019/11/16, 9:23 AM

## 2019-11-02 NOTE — ED Notes (Signed)
Report to mary, rn.  

## 2019-11-02 NOTE — ED Provider Notes (Signed)
[facility]  Department of Emergency Medicine   Code Blue CONSULT NOTE  Chief Complaint: Cardiac arrest/unresponsive   Level V Caveat: Unresponsive  History of present illness: I was contacted by the hospital for a CODE BLUE cardiac arrest in CT scan and presented to the patient's bedside.  Patient being admitted for altered mental status, severe lactic acidosis.  On heparin and Levophed  Surgeon and intensivist at bedside on my arrival  ROS: Unable to obtain, Level V caveat  Scheduled Meds: . aspirin EC  81 mg Oral Daily  . folic acid  1 mg Intravenous Daily  . heparin injection (subcutaneous)  5,000 Units Subcutaneous Q8H  . pantoprazole (PROTONIX) IV  40 mg Intravenous Q12H  . senna  1 tablet Oral BID  . sodium chloride flush  3 mL Intravenous Q12H   Continuous Infusions: . ciprofloxacin Stopped (11-07-19 0107)  . metronidazole Stopped (November 07, 2019 0027)  . norepinephrine (LEVOPHED) Adult infusion 16 mcg/min (November 07, 2019 0650)  . potassium chloride 10 mEq (11-07-2019 0748)  . sodium bicarbonate 150 mEq in dextrose 5% 1000 mL 150 mEq (11/07/19 0758)  . [START ON 10/20/2019] vancomycin     PRN Meds:.acetaminophen **OR** acetaminophen, iohexol, ondansetron **OR** ondansetron (ZOFRAN) IV Past Medical History:  Diagnosis Date  . Cellulitis, leg   . Cirrhosis of liver ([place])    Per granddaughter  . GERD (gastroesophageal reflux disease)   . Hypertension    Past Surgical History:  Procedure Laterality Date  . COLONOSCOPY WITH PROPOFOL N/A 07/28/2018   Procedure: COLONOSCOPY WITH PROPOFOL;  Surgeon: Virgel Manifold, MD;  Location: [facility] ENDOSCOPY;  Service: Endoscopy;  Laterality: N/A;  . ESOPHAGOGASTRODUODENOSCOPY (EGD) WITH PROPOFOL N/A 07/28/2018   Procedure: ESOPHAGOGASTRODUODENOSCOPY (EGD) WITH PROPOFOL;  Surgeon: Virgel Manifold, MD;  Location: [facility] ENDOSCOPY;  Service: Endoscopy;  Laterality: N/A;   Social History   Socioeconomic History  . Marital  status: Single    Spouse name: Not on file  . Number of children: 0  . Years of education: Not on file  . Highest education level: Not on file  Occupational History  . Occupation: Psychologist, clinical, retired  Tobacco Use  . Smoking status: Never Smoker  . Smokeless tobacco: Never Used  Substance and Sexual Activity  . Alcohol use: Yes    Comment: fell from drinking [employer]  . Drug use: No  . Sexual activity: Not Currently  Other Topics Concern  . Not on file  Social History Narrative  . Not on file   Social Determinants of Health   Financial Resource Strain:   . Difficulty of Paying Living Expenses: Not on file  Food Insecurity:   . Worried About Charity fundraiser in the Last Year: Not on file  . Ran Out of Food in the Last Year: Not on file  Transportation Needs:   . Lack of Transportation (Medical): Not on file  . Lack of Transportation (Non-Medical): Not on file  Physical Activity:   . Days of Exercise per Week: Not on file  . Minutes of Exercise per Session: Not on file  Stress:   . Feeling of Stress : Not on file  Social Connections:   . Frequency of Communication with Friends and Family: Not on file  . Frequency of Social Gatherings with Friends and Family: Not on file  . Attends Religious Services: Not on file  . Active Member of Clubs or Organizations: Not on file  . Attends Archivist Meetings: Not on file  .  Marital Status: Not on file  Intimate Partner Violence:   . Fear of Current or Ex-Partner: Not on file  . Emotionally Abused: Not on file  . Physically Abused: Not on file  . Sexually Abused: Not on file   No Known Allergies  Last set of Vital Signs (not current) Vitals:   November 12, 2019 0700 11/12/2019 0715  BP: 119/67 97/65  Pulse: 97 (!) 129  Resp: (!) 28 (!) 24  Temp:    SpO2: 100% 100%      Physical Exam  Gen: unresponsive Cardiovascular: pulseless  Resp: apneic. Breath sounds equal bilaterally with bagging  Abd: Mildly  distended Neuro: GCS 3, unresponsive to pain  HEENT: Clear fluid in oropharynx, gag reflex absent  Neck: No crepitus  Musculoskeletal: No deformity  Skin: warm  Procedures  INTUBATION Performed by: Carrie Mew Required items: required blood products, implants, devices, and special equipment available Patient identity confirmed: provided demographic data and hospital-assigned identification number Time out: Immediately prior to procedure a "time out" was called to verify the correct patient, procedure, equipment, support staff and site/side marked as required. Indications: Respiratory failure, cardiac arrest Intubation method: Direct laryngoscopy, MAC 3 Preoxygenation: BVM Sedatives: None Paralytic: None Tube Size: 7.0 cuffed Post-procedure assessment: chest rise and positive color change on colorimetric capnometer.  There is blood in the ET tube with bagging Breath sounds: equal and absent over the epigastrium Tube secured by Respiratory Therapy, 24 cm at the lip    Medical Decision making  ROSC achieved post airway suctioning and post intubation, possibly respiratory arrest due to aspiration  Critical care/ACLS directed by intensivist Dr. Mortimer Fries at bedside  Assessment and Plan  To ICU for further care    Carrie Mew, MD 11-12-2019 [phone]

## 2019-11-02 NOTE — ED Notes (Signed)
Pt with improved mentation, pt is alert and oriented to person, place and situation, disoriented to time. Tea colored cloudy urine draining into drainage bag. Pt no longer with increased work of breathing, removed from oxygen and tolerating well. Pt states she is tired and would like to rest. Lights dimmed for comfort. Pt in nsr currently, has converted out of a fib.

## 2019-11-02 NOTE — ED Notes (Addendum)
Pt to ct at [facility]. At 0846, monitor showed no heartbeat. Pt pulled out of CT scanner, checked for pulse, and compressions were started. Dr. Hampton Abbot, his PA, and a PA student were present. Code Blue was called and Dr. Joni Fears and Nira Conn, charge nurse responded. Other staff responded as well. First epi given at 0850, with subsequent dosea at [place],  [place], 0900, [place], 0907. She was also given 1 amp calcium and 1 amp bicarb at 0852 and 0856, CBG checked, found to be 44, and pt given 1 amp D50 at 0858.  pulse returned at 0901, with bp 104/70. Pulse was lost again, regained at 0908. At 0910, bp was 146/112, pulse erratic at 110-150; pt transferred to ICU 6, arriving at 0917, where she expired. Belongings sent to ICU for delivery to family.  Pt had been mumbling prior to CT, but actually answered Dr. Mont Dutton questions just before she entered the CT scanner. ("are you in pain? Yes. Where? Everywhere.")

## 2019-11-02 NOTE — ED Notes (Signed)
Pt states nausea is gone, head of bed elevated to 45 degrees per pt request and to prevent aspiration.

## 2019-11-02 NOTE — Progress Notes (Signed)
10/21/2019  Contacted by Mr. Dewaine Conger regarding this patient.  Has progressively worsening lactic acidosis despite of aggressive hydration, requiring pressors for hypotension.  Had atrial fibrillation with RVR which was controlled with metoprolol.  CT scan of abdomen and pelvis w/o contrast obtained.  I have independently viewed the imaging studies.  Although report mentions rght and transverse colon wall thickening, this is minimal at most.  I dont think there's significant wall thickening, there is no pneumatosis, there is no surrounding stranding around the colon or the mesentery.  There is only mild at most inflammation around the pancreas.  Although CT was without contrast, there is no significant atherosclerosis of the aorta or mesenteric vessels to indicate mesenteric ischemia.  There is no bowel distention, no free fluid, no pneumoperitoneum.  Lipase is 40.  WBC is 4.6.  However, lactic acid is 7.6.  The etiology of the patient's clinical condition is unclear but CT scan findings do not support such hypotension and acidosis.  Will keep following along with you.   Olean Ree, MD

## 2019-11-02 NOTE — ED Notes (Signed)
Pt denies needs. Pt intermittently removing monitoring equipment.

## 2019-11-02 NOTE — Progress Notes (Signed)
PHARMACY CONSULT NOTE - FOLLOW UP  Pharmacy Consult for Electrolyte Monitoring and Replacement   Recent Labs: Potassium (mmol/L)  Date Value  2019-10-21 2.7 (LL)  01/14/2014 3.3 (L)   Magnesium (mg/dL)  Date Value  21-Oct-2019 2.4  01/14/2014 1.8   Calcium (mg/dL)  Date Value  October 21, 2019 7.2 (L)   Calcium, Total (mg/dL)  Date Value  01/14/2014 7.6 (L)   Albumin (g/dL)  Date Value  10/21/2019 2.6 (L)  01/10/2014 2.3 (L)   Phosphorus (mg/dL)  Date Value  10/21/2019 2.7   Sodium (mmol/L)  Date Value  10/21/19 141  01/14/2014 139   Correct Ca: 8.32   Assessment: Patient is a 74 yo female admitted for intra-abdominal infection. Patient with significant electrolyte abnormalities on admission. Pharmacy consulted for electrolyte management.  Patient received the following replacements in the ED. Mag Sulfate 4g IV times 1 KCl 49mEq PO times 1 KCl 37mEq IV times 4 NaPhos 30 mmol IV times 1  Patient currently ordered Sodium Bicarb 150 mEq in D5W @ 150 mL/hr   Goal of Therapy:  Maintain electrolytes WNL  Plan:  2/15 AM: Phos and Mg WNL. K: 2.7. Patient has orders for KCL  40mEq IV x 6 doses.   Will recheck K+ @ 1400 this afternoon.   Pharmacy will continue to follow and replace electrolytes as needed.   Pernell Dupre, PharmD, BCPS Clinical Pharmacist 2019-10-21 8:31 AM

## 2019-11-02 NOTE — Progress Notes (Signed)
CRITICAL CARE NOTE  CC  follow up respiratory failure  SUBJECTIVE Patient remains critically ill Prognosis is guarded Severe cardiac arrest   SEVERE CARDIAC ARREST SEVERE RESP FAILURE ACLS PROTOCOL STARTED CPR FOR 32 MINS PATIENT HAD 3 EPISODES OF CARDIAC ARREST  PATIENT HAD 4th CARDIAC ARREST and PATIENT DIED   BP 97/65   Pulse (!) 129   Temp 98.1 F (36.7 C) (Oral)   Resp (!) 24   Ht 5\' 2"  (1.575 m)   Wt 74.8 kg   SpO2 100%   BMI 30.18 kg/m    I/O last 3 completed shifts: In: 7604 [IV Piggyback:7604] Out: 470 [Urine:170; Emesis/NG output:300] Total I/O In: 41 [IV Piggyback:50] Out: -   SpO2: 100 % O2 Flow Rate (L/min): 15 L/min    REVIEW OF SYSTEMS  PATIENT IS UNABLE TO PROVIDE COMPLETE REVIEW OF SYSTEMS DUE TO SEVERE CRITICAL ILLNESS   PHYSICAL EXAMINATION:  GENERAL:critically ill appearing, +resp distress HEAD: Normocephalic, atraumatic.  EYES: Pupils equal, round, reactive to light.  No scleral icterus.  MOUTH: Moist mucosal membrane. NECK: Supple.  PULMONARY: +rhonchi, +wheezing CARDIOVASCULAR: S1 and S2. Regular rate and rhythm. No murmurs, rubs, or gallops.  GASTROINTESTINAL: Soft, nontender, -distended.  Positive bowel sounds.   MUSCULOSKELETAL: No swelling, clubbing, or edema.  NEUROLOGIC: obtunded, GCS<8 SKIN:intact,warm,dry  MEDICATIONS: I have reviewed all medications and confirmed regimen as documented   CULTURE RESULTS   Recent Results (from the past 240 hour(s))  Culture, blood (routine x 2)     Status: None (Preliminary result)   Collection Time: 10/27/2019 12:40 PM   Specimen: BLOOD  Result Value Ref Range Status   Specimen Description BLOOD RAC  Final   Special Requests   Final    BOTTLES DRAWN AEROBIC AND ANAEROBIC Blood Culture results may not be optimal due to an excessive volume of blood received in culture bottles   Culture   Final    NO GROWTH < 24 HOURS Performed at [facility], [address],  [place], [place] [address]    Report Status PENDING  Incomplete  Culture, blood (routine x 2)     Status: None (Preliminary result)   Collection Time: 10/09/2019 12:40 PM   Specimen: BLOOD  Result Value Ref Range Status   Specimen Description BLOOD LAC  Final   Special Requests   Final    BOTTLES DRAWN AEROBIC AND ANAEROBIC Blood Culture adequate volume   Culture   Final    NO GROWTH < 24 HOURS Performed at [facility], [address] [place], [place] [address]    Report Status PENDING  Incomplete  SARS CORONAVIRUS 2 (TAT 6-24 HRS) Nasopharyngeal Nasopharyngeal Swab     Status: None   Collection Time: 10/16/2019 12:59 PM   Specimen: Nasopharyngeal Swab  Result Value Ref Range Status   SARS Coronavirus 2 NEGATIVE NEGATIVE Final    Comment: (NOTE) SARS-CoV-2 target nucleic acids are NOT DETECTED. The SARS-CoV-2 RNA is generally detectable in upper and lower respiratory specimens during the acute phase of infection. Negative results do not preclude SARS-CoV-2 infection, do not rule out co-infections with other pathogens, and should not be used as the sole basis for treatment or other patient management decisions. Negative results must be combined with clinical observations, patient history, and epidemiological information. The expected result is Negative. Fact Sheet for Patients: SugarRoll.be Fact Sheet for Healthcare Providers: https://www.woods-mathews.com/ This test is not yet approved or cleared by the [place] FDA and  has been authorized for detection  and/or diagnosis of SARS-CoV-2 by FDA under an Emergency Use Authorization (EUA). This EUA will remain  in effect (meaning this test can be used) for the duration of the COVID-19 declaration under Section 56 4(b)(1) of the Act, 21 U.S.C. section 360bbb-3(b)(1), unless the authorization is terminated or revoked sooner. Performed at [facility], [place] [address]  [place], [place] [address]           IMAGING    CT ABDOMEN PELVIS WO CONTRAST  Result Date: 10/12/2019 CLINICAL DATA:  Sepsis, elevated lactic acid EXAM: CT ABDOMEN AND PELVIS WITHOUT CONTRAST TECHNIQUE: Multidetector CT imaging of the abdomen and pelvis was performed following the standard protocol without IV contrast. COMPARISON:  04/16/2017 FINDINGS: Lower chest: Hypoventilatory changes are seen at the lung bases. Hepatobiliary: There is extensive fatty infiltration of the liver. Increased attenuation of gallbladder contents consistent with sludge. No fat stranding to suggest cholecystitis. Pancreas: There is mild fat stranding surrounding the pancreatic parenchyma which could reflect acute pancreatitis. Please correlate with laboratory values. No fluid collection or abscess. Spleen: Normal in size without focal abnormality. Adrenals/Urinary Tract: No urinary tract calculi or obstructive uropathy. The adrenals are unremarkable. Bladder is decompressed with a Foley catheter. Stomach/Bowel: There is no bowel obstruction or ileus. Evaluation is limited without intravenous and oral contrast. There is at least mild mural thickening involving the proximal colon from the cecum through the distal transverse colon. No evidence of pneumatosis. There is diverticulosis of the distal descending colon without diverticulitis. Vascular/Lymphatic: Multiple small lymph nodes are seen surrounding the pancreas, likely reactive. No pathologically enlarged lymph nodes. Minimal atherosclerosis of the aorta. Reproductive: Uterus and bilateral adnexa are unremarkable. Other: No abdominal wall hernia or abnormality. No abdominopelvic ascites. Musculoskeletal: There are no acute or destructive bony lesions. Reconstructed images demonstrate no additional findings. IMPRESSION: 1. Inflammatory changes surrounding the pancreas, consistent with acute uncomplicated pancreatitis. Please correlate with laboratory values. 2. High attenuation  material within the gallbladder, likely sludge. No CT evidence of cholecystitis. 3. Mild mural thickening from the cecum to the distal transverse colon. This could reflect inflammatory or infectious colitis, though the distribution of findings coupled with the history of elevated lactic acid raise the possibility of ischemic colitis. Please correlate with clinical exam findings. 4. Diffuse fatty infiltration of the liver. 5. Descending colonic diverticulosis without diverticulitis. Electronically Signed   By: Randa Ngo M.D.   On: 10/21/2019 22:07   DG Chest 1 View  Result Date: 2019-11-14 CLINICAL DATA:  NG tube placement. EXAM: CHEST  1 VIEW COMPARISON:  Chest radiograph 30 minutes prior. FINDINGS: Tip and side port of the enteric tube below the diaphragm in the stomach. Cardiomegaly is unchanged. Patchy opacity at the lung bases, small right pleural effusion unchanged. No pneumothorax. IMPRESSION: Tip and side port of the enteric tube below the diaphragm in the stomach. Electronically Signed   By: Keith Rake M.D.   On: 11-14-2019 06:24   DG Chest 1 View  Result Date: 10/11/2019 CLINICAL DATA:  Seizure-like activity. Not feeling well for 4 days weakness, headache, decreased appetite. EXAM: CHEST  1 VIEW COMPARISON:  Chest x-ray dated 07/25/2018. FINDINGS: Stable cardiomegaly. Lungs are clear. No pleural effusion or pneumothorax is seen. Osseous structures about the chest are unremarkable. IMPRESSION: No active disease. No evidence of pneumonia or pulmonary edema. Stable cardiomegaly. Electronically Signed   By: Franki Cabot M.D.   On: 10/11/2019 13:53   CT Head Wo Contrast  Result Date: 10/20/2019 CLINICAL DATA:  Seizure-like  activity. EXAM: CT HEAD WITHOUT CONTRAST TECHNIQUE: Contiguous axial images were obtained from the base of the skull through the vertex without intravenous contrast. COMPARISON:  March 22, 2019 FINDINGS: Brain: No evidence of acute infarction, hemorrhage, hydrocephalus,  extra-axial collection or mass lesion/mass effect. There is chronic diffuse atrophy. Mild chronic bilateral periventricular white matter small vessel ischemic changes are noted. Vascular: No hyperdense vessels noted. Skull: Normal. Negative for fracture or focal lesion. Sinuses/Orbits: No acute finding. Other: None. IMPRESSION: No focal acute intracranial abnormality identified. Chronic diffuse atrophy. Mild chronic bilateral periventricular white matter small vessel ischemic change. Electronically Signed   By: Abelardo Diesel M.D.   On: 10/21/2019 13:38   DG Chest Port 1 View  Result Date: Nov 18, 2019 CLINICAL DATA:  Acute respiratory failure with hypoxia. EXAM: PORTABLE CHEST 1 VIEW COMPARISON:  Radiograph yesterday. FINDINGS: Unchanged cardiomegaly. Unchanged mediastinal contours. Developing vascular congestion with streaky opacities in the lung bases. Possible small right pleural effusion. No pneumothorax. IMPRESSION: Developing vascular congestion and streaky bibasilar opacities, likely atelectasis. Possible small right pleural effusion. Electronically Signed   By: Keith Rake M.D.   On: 18-Nov-2019 05:46        ASSESSMENT AND PLAN SYNOPSIS   Severe ACUTE Hypoxic and Hypercapnic Respiratory Failure From severe ischemic colitis and sudden cardiac death    Patient passed at 86 AM  GRANDDAUGHTER AND SISTER NOTIFIED [place] Time devoted to patient care services described in this note is 74minutes.   Overall, patient is critically ill, prognosis is guarded.  Patient with Multiorgan failure and at high risk for cardiac arrest and death.    Corrin Parker, M.D.  [facility]  Medical Director [place] Director [facility] Cardio-Pulmonary Department

## 2019-11-02 NOTE — ED Notes (Signed)
Morgan Settler, np notified of critical lactic of 9.7, lactic acid value was not called by lab.

## 2019-11-02 NOTE — Progress Notes (Signed)
Pharmacy Antibiotic Note  Morgan Mcpherson is a 74 y.o. female admitted on 10/07/2019 with sepsis.  Pharmacy has been consulted for Vancomcyin dosing.  Plan: Vancomycin 1500mg  IV loading dose followed by Vancomycin 1000 mg IV Q 24 hrs. Goal AUC 400-550. Expected AUC: 502.2 SCr used: 1.04 Expected Cmin: 13.5   Height: 5\' 2"  (157.5 cm) Weight: 165 lb (74.8 kg) IBW/kg (Calculated) : 50.1  Temp (24hrs), Avg:97.6 F (36.4 C), Min:97 F (36.1 C), Max:98.1 F (36.7 C)  Recent Labs  Lab 10/08/2019 1240 11/01/2019 1241 10/27/2019 1507 10/23/2019 2002 10/12/2019 2328 11-05-2019 0216 05-Nov-2019 0410 11/05/2019 0411  WBC  --  4.6  --   --   --   --   --  6.2  CREATININE  --  1.17*  --   --   --   --   --  1.04*  LATICACIDVEN   < >  --  6.6* 7.6* 8.1* 9.7* 10.5*  --    < > = values in this interval not displayed.    Estimated Creatinine Clearance: 47 mL/min (A) (by C-G formula based on SCr of 1.04 mg/dL (H)).    No Known Allergies  Antimicrobials this admission: Cipro 2/15 >>  Metronidazole 2/15 >>  Vancomycin 2/15 >>  Dose adjustments this admission:  Microbiology results:  Thank you for allowing pharmacy to be a part of this patient's care.  Paulina Fusi, PharmD, BCPS 11/05/19 5:41 AM

## 2019-11-02 NOTE — ED Notes (Signed)
Sharion Settler, np notified regarding pt with 23mL of urine output since 0030. Previous 118mL entered in error, was another pt's value.

## 2019-11-02 NOTE — ED Notes (Signed)
Sheets changed, pt's t shirt discarded due to soiling and cut of t shirt, pt consents to discard, other belongings in belonging bag with exception of glasses which are next to bag on counter next to sink in room.

## 2019-11-02 NOTE — Progress Notes (Signed)
Despite adequate fluid volume and initiation of antibiotics, patient with progressing metabolic acidosis and worsening lactic acid and unclear etiology. NG placed for oral contrast tolerance to further eval prior ct findings.  Will include CTA chest to eval for PE and or infection source as etiology of acidosis.  Severe metabolic acidosis (anion gap 22) now with bicarb deficit aeb CO2 10, previous 22.  Ordered 2 amp sodium bicarb push with 3 amp continuous infusion.  Repeat CMP (to assees hepatobiliary function . transaminitis minimal , insignificant lipase elevation improving, no elevation in bilirubin Patient also noted with minimal urine output despite fluids.  Serum creatinine now 1.17 up from 0.45.  Likely ATN from prior hypotension.  Continue bicarb infusion , monitor renal function. Avoid nephrotoxic meds.  Decrease in H & H likely diluted. Type and screen entered.  Potassium and phosphorous replacement ordered previous. Awaiting am mag level.

## 2019-11-02 NOTE — ED Notes (Signed)
Morgan Settler, np at bedside, notified verbally of potassium 2.7 and lactic of 10.5. no new verbal orders received.

## 2019-11-02 NOTE — ED Notes (Signed)
Waiting on vancomycin from pharmacy

## 2019-11-02 NOTE — Progress Notes (Signed)
[place] responded to code blue at approx. 8:50am in CT scan w/[facility] Clonts; [facility] remained w/pt. while [place] attempted to locate family in ED/waiting area.  Medical team doing compressions when [place] arrived; after approx 30min CPR team able to find pulse --> pt. transferred to ICU. When [facility] followed up on ICU after rounds, Pt. had died in ICU room.  Stepdtr. Olin Hauser) arrived at approx. 10:30; [place] brought her to rm. to view body and say goodbyes.  Stepdtr. shared pt. and her dtr. (Pt.'s grdtr.) were very close.  Stpdtr. remarked 'she looks very peaceful' re: pt.'s appearance.  [place] asked stepdtr. to complete funeral release details.  No further needs expressed at this time.        11/02/2019 0850  Clinical Encounter Type  Visited With Patient not available;Health care provider;Family;Patient  Visit Type Social support;Psychological support;Death;Critical Care;Code  Referral From Nurse  Spiritual Encounters  Spiritual Needs Grief support  Stress Factors  Family Stress Factors Major life changes;Health changes

## 2019-11-02 NOTE — Progress Notes (Signed)
2019-11-18 9:15 am  Presented to the ED to evaluate the patient.  She had just gone to CT scan for CTA chest, abdomen, and pelvis.  We proceeded to CT to be able to see the images and talk with the patient.  On arrival, the patient was on the CT bed.  I was able to ask her how she was doing, and if she had any pain, and she mentioned everywhere.  She did appear.  Her abdomen was soft, and did not appear peritonitic.  As the contrast was getting injected, it was noted there was no rhythm on EKG monitor.  Code Blue was called.  CPR was started.  There was loss of pulse twice and regained.  Please see Dr. Zoila Shutter and Dr. Jerene Canny notes for further details.  During ACLS, she was also noted to have blood from her nose and in the oropharynx while attempting intubation.  She is getting transferred to ICU now.    Overall, patient had worsening lactic acidosis despite of aggressive resuscitation and pressor support.  Etiology is unclear.  Her previous CT scan without contrast does not show any pneumatosis, portal venous air, free air, free fluid, or swirl sign of the blood vessels.  There was very mild at most thickening of the wall of the right and transverse colon, and very minimal stranding around the pancreas, with lipase of only 40, and WBC of 6.2.  There was no other stranding around the colon, or in the mesentery either.  On exam prior to CT attempt this morning, she was soft, and non-peritoneal.  At this point, the patient is critically ill and too unstable for any surgical intervention, although there is no clear target that warrants surgical intervention either.  We will follow along with you.  Olean Ree, MD

## 2019-11-02 NOTE — Progress Notes (Signed)
PHARMACY CONSULT NOTE - FOLLOW UP  Pharmacy Consult for Electrolyte Monitoring and Replacement   Recent Labs: Potassium (mmol/L)  Date Value  10/26/2019 2.6 (LL)  01/14/2014 3.3 (L)   Magnesium (mg/dL)  Date Value  10/23/2019 1.3 (L)  01/14/2014 1.8   Calcium (mg/dL)  Date Value  10/11/2019 9.5   Calcium, Total (mg/dL)  Date Value  01/14/2014 7.6 (L)   Albumin (g/dL)  Date Value  10/10/2019 4.1  01/10/2014 2.3 (L)   Phosphorus (mg/dL)  Date Value  10/14/2019 <1.0 (LL)   Sodium (mmol/L)  Date Value  10/31/2019 144  01/14/2014 139     Assessment: Patient is a 74 yo female admitted for intra-abdominal infection. Patient with significant electrolyte abnormalities on admission. Pharmacy consulted for electrolyte management.  K=2.6, Mag=1.3, Phos=<1.0  Patient received the following replacements in the ED. Mag Sulfate 4g IV times 1 KCl 31mEq PO times 1 KCl 65mEq IV times 4 NaPhos 30 mmol IV times 1  Goal of Therapy:  Maintain electrolytes within range  Plan:  Will follow up on AM labs for further replacement needs.  Paulina Fusi, PharmD, BCPS 11/07/19 12:38 AM

## 2019-11-02 DEATH — deceased

## 2021-11-17 IMAGING — DX DG CHEST 1V PORT
1 series · 1 of 1 positions shown · non-contrast
Comparison: Radiograph yesterday.

CLINICAL DATA: Acute respiratory failure with hypoxia.

EXAM:
PORTABLE CHEST 1 VIEW

[chest ap]
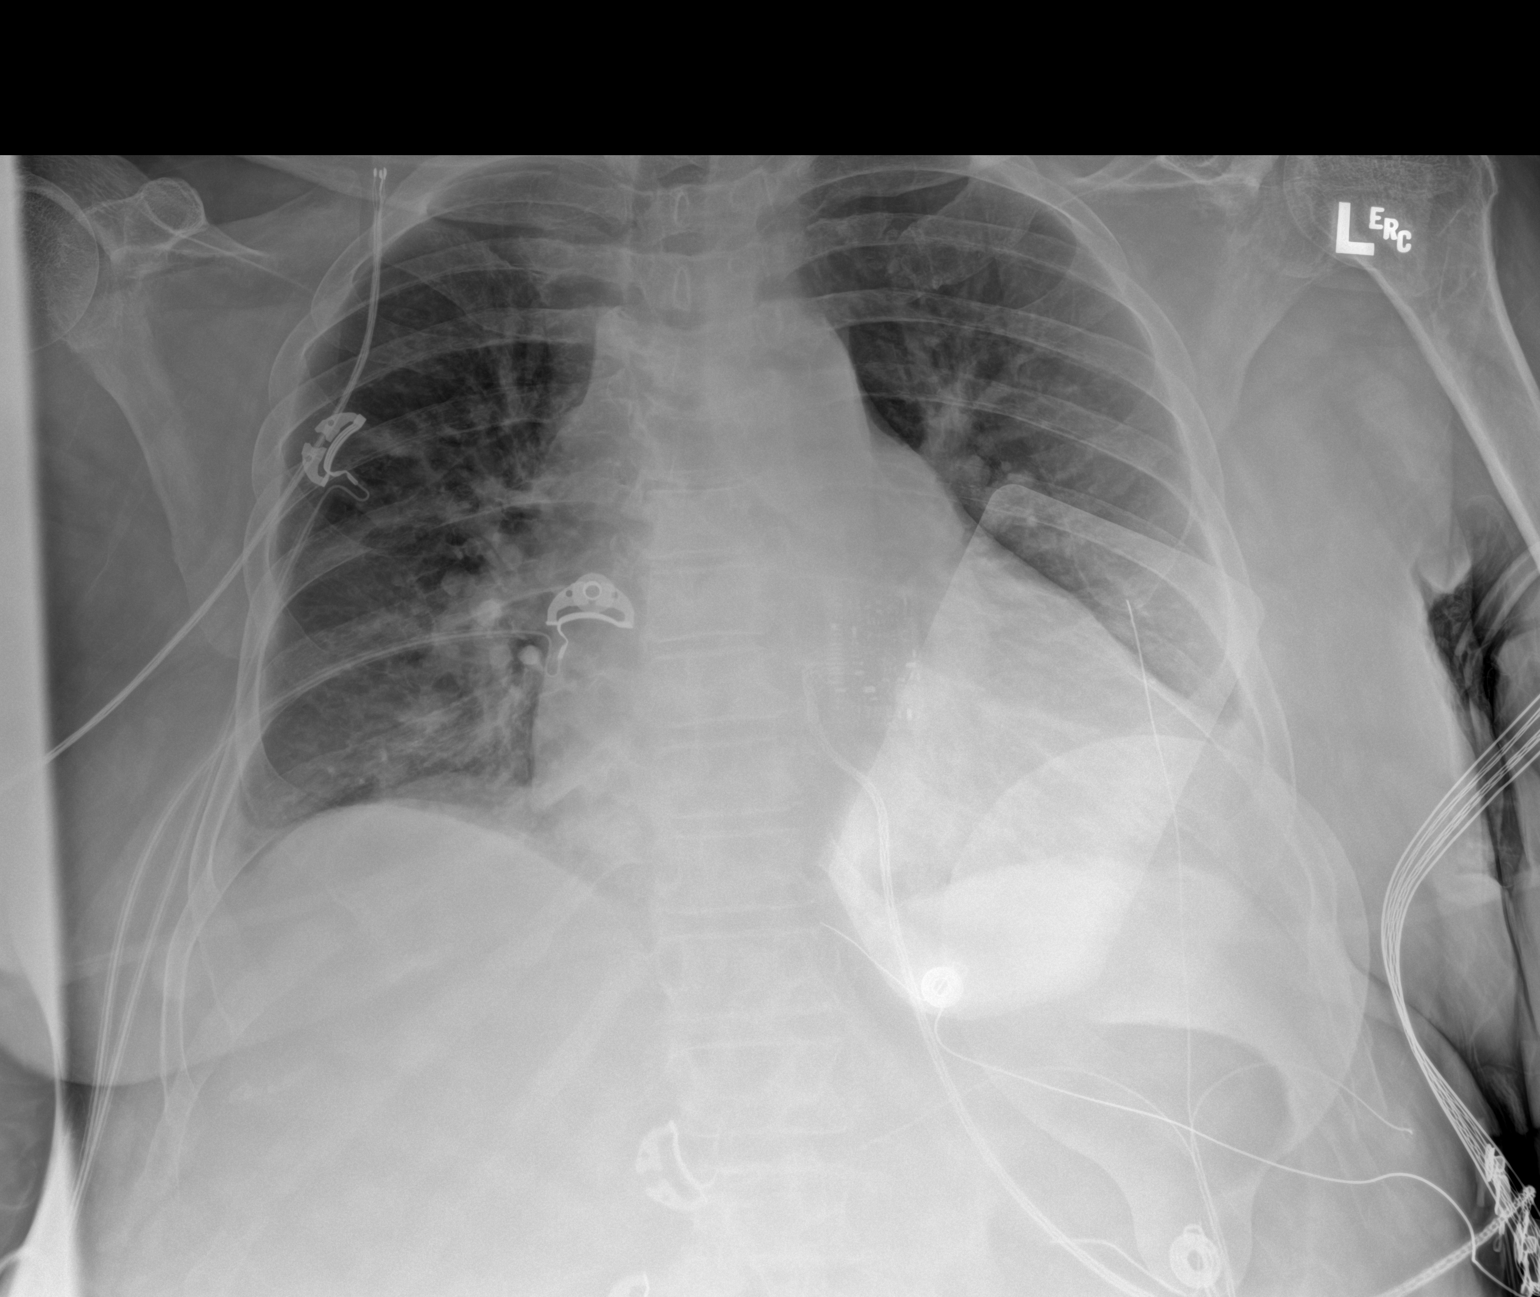

[1 of 1 positions shown; findings below may reference images not displayed]

FINDINGS: Unchanged cardiomegaly. Unchanged mediastinal contours. Developing
vascular congestion with streaky opacities in the lung bases.
Possible small right pleural effusion. No pneumothorax.
IMPRESSION: Developing vascular congestion and streaky bibasilar opacities,
likely atelectasis. Possible small right pleural effusion.
